# Patient Record
Sex: Female | Born: 1955 | Race: White | Hispanic: No | Marital: Married | Smoking: Former smoker
Health system: Southern US, Community
[De-identification: ages and names within clinical notes are randomized; demographics above are authoritative.]

## PROBLEM LIST (undated history)

## (undated) DIAGNOSIS — I10 Essential (primary) hypertension: Secondary | ICD-10-CM

## (undated) DIAGNOSIS — E785 Hyperlipidemia, unspecified: Secondary | ICD-10-CM

## (undated) DIAGNOSIS — R002 Palpitations: Secondary | ICD-10-CM

## (undated) DIAGNOSIS — Z720 Tobacco use: Secondary | ICD-10-CM

## (undated) HISTORY — DX: Palpitations: R00.2

## (undated) HISTORY — DX: Hyperlipidemia, unspecified: E78.5

## (undated) HISTORY — PX: BREAST EXCISIONAL BIOPSY: SUR124

## (undated) HISTORY — DX: Essential (primary) hypertension: I10

## (undated) HISTORY — DX: Tobacco use: Z72.0

## (undated) HISTORY — PX: ABDOMINAL HYSTERECTOMY: SHX81

## (undated) HISTORY — PX: BREAST LUMPECTOMY: SHX2

---

## 1997-07-23 ENCOUNTER — Ambulatory Visit (HOSPITAL_COMMUNITY): Admission: RE | Admit: 1997-07-23 | Discharge: 1997-07-23 | Payer: Self-pay | Admitting: Emergency Medicine

## 1998-06-21 ENCOUNTER — Encounter: Payer: Self-pay | Admitting: Oral Surgery

## 1998-06-25 ENCOUNTER — Ambulatory Visit (HOSPITAL_COMMUNITY): Admission: RE | Admit: 1998-06-25 | Discharge: 1998-06-26 | Payer: Self-pay | Admitting: Oral Surgery

## 2005-02-27 ENCOUNTER — Ambulatory Visit (HOSPITAL_COMMUNITY): Admission: RE | Admit: 2005-02-27 | Discharge: 2005-02-27 | Payer: Self-pay | Admitting: Obstetrics and Gynecology

## 2010-11-10 ENCOUNTER — Other Ambulatory Visit (HOSPITAL_COMMUNITY): Payer: Self-pay | Admitting: Obstetrics and Gynecology

## 2010-11-10 DIAGNOSIS — Z1231 Encounter for screening mammogram for malignant neoplasm of breast: Secondary | ICD-10-CM

## 2010-12-10 ENCOUNTER — Ambulatory Visit (HOSPITAL_COMMUNITY)
Admission: RE | Admit: 2010-12-10 | Discharge: 2010-12-10 | Disposition: A | Discharge status: Home / Self Care | Payer: Managed Care, Other (non HMO) | Source: Ambulatory Visit | Attending: Obstetrics and Gynecology | Admitting: Obstetrics and Gynecology

## 2010-12-10 DIAGNOSIS — Z1231 Encounter for screening mammogram for malignant neoplasm of breast: Secondary | ICD-10-CM | POA: Insufficient documentation

## 2010-12-17 DIAGNOSIS — R002 Palpitations: Secondary | ICD-10-CM

## 2010-12-17 DIAGNOSIS — E785 Hyperlipidemia, unspecified: Secondary | ICD-10-CM | POA: Insufficient documentation

## 2010-12-17 DIAGNOSIS — R7989 Other specified abnormal findings of blood chemistry: Secondary | ICD-10-CM | POA: Insufficient documentation

## 2010-12-17 HISTORY — DX: Palpitations: R00.2

## 2010-12-17 LAB — LIPID PANEL
Cholesterol: 286 mg/dL — AB (ref 0–200)
HDL: 43 mg/dL (ref 35–70)
LDL Cholesterol: 182 mg/dL
LDl/HDL Ratio: 61
Triglycerides: 304 mg/dL — AB (ref 40–160)

## 2010-12-17 LAB — HEPATIC FUNCTION PANEL
ALT: 54 U/L — AB (ref 7–35)
AST: 43 U/L — AB (ref 13–35)
Alkaline Phosphatase: 123 U/L (ref 25–125)

## 2011-03-29 DIAGNOSIS — E785 Hyperlipidemia, unspecified: Secondary | ICD-10-CM

## 2011-03-29 DIAGNOSIS — F17200 Nicotine dependence, unspecified, uncomplicated: Secondary | ICD-10-CM

## 2011-03-29 DIAGNOSIS — I10 Essential (primary) hypertension: Secondary | ICD-10-CM | POA: Insufficient documentation

## 2011-03-29 DIAGNOSIS — R7989 Other specified abnormal findings of blood chemistry: Secondary | ICD-10-CM

## 2011-03-29 DIAGNOSIS — Z87891 Personal history of nicotine dependence: Secondary | ICD-10-CM | POA: Insufficient documentation

## 2011-04-15 ENCOUNTER — Ambulatory Visit (INDEPENDENT_AMBULATORY_CARE_PROVIDER_SITE_OTHER): Payer: Managed Care, Other (non HMO) | Admitting: Family Medicine

## 2011-04-15 ENCOUNTER — Encounter: Payer: Self-pay | Admitting: Family Medicine

## 2011-04-15 DIAGNOSIS — R079 Chest pain, unspecified: Secondary | ICD-10-CM

## 2011-04-15 DIAGNOSIS — E785 Hyperlipidemia, unspecified: Secondary | ICD-10-CM

## 2011-04-15 DIAGNOSIS — I10 Essential (primary) hypertension: Secondary | ICD-10-CM

## 2011-04-15 LAB — HEPATIC FUNCTION PANEL
ALT: 33 U/L (ref 0–35)
AST: 31 U/L (ref 0–37)
Alkaline Phosphatase: 99 U/L (ref 39–117)
Bilirubin, Direct: 0.1 mg/dL (ref 0.0–0.3)
Indirect Bilirubin: 0.4 mg/dL (ref 0.0–0.9)
Total Bilirubin: 0.5 mg/dL (ref 0.3–1.2)

## 2011-04-15 LAB — LIPID PANEL
Cholesterol: 278 mg/dL — ABNORMAL HIGH (ref 0–200)
Total CHOL/HDL Ratio: 7.1 Ratio

## 2011-04-15 NOTE — Progress Notes (Signed)
  Subjective:    Patient ID: Marissa Howard, female    DOB: [birth year], 56 y.o.   MRN: [id number]  HPI 56 y.o Cauc female presents for HTN follow-up. She feels better on her medication however she has been having palpitations and chest tightness with pressure that has awaken her from sleep a few nights a week. This started 3-4 weeks ago. She is a smoker and has been contemplating quitting. 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 She has recently voiced her discomfort with the situation but still finds that sometimes she feels like she is holding her breath or has a hard time catching her breath. She also notes some epigastric pain and has a hx of dyspepsia which required Prilosec in the past. Leslie Dales has helped recently.    Review of Systems  Constitutional: Negative.   HENT: Negative.   Eyes: Negative.   Respiratory: Positive for chest tightness and shortness of breath. Negative for cough.   Cardiovascular: Positive for chest pain and palpitations.  Gastrointestinal: Positive for nausea and abdominal pain. Negative for vomiting, constipation and blood in stool.  Genitourinary: Negative.   Musculoskeletal: Negative for back pain.  Neurological: Negative for dizziness, syncope, weakness and light-headedness.  Psychiatric/Behavioral: The patient is nervous/anxious.        Objective:   Physical Exam  Constitutional: She is oriented to person, place, and time. She appears well-developed and well-nourished. No distress.  HENT:  Head: Normocephalic and atraumatic.  Eyes: Conjunctivae and EOM are normal. Pupils are equal, round, and reactive to light.  Neck: Normal range of motion. No thyromegaly present.  Cardiovascular: Normal rate, regular rhythm and normal heart sounds.   No murmur heard. Pulmonary/Chest: Effort normal and breath sounds normal.  Abdominal: Soft. Bowel sounds are normal. She  exhibits no mass. There is no tenderness.  Musculoskeletal: She exhibits no edema.  Neurological: She is alert and oriented to person, place, and time.  Skin: She is not diaphoretic.  Psychiatric: She has a normal mood and affect. Her behavior is normal.   ECG: NSR; no significant change from 12/17/2010 ECG. Device analysis reads "Possible septal            Infarct- age undetermined"       Assessment & Plan:   1. HTN (hypertension)  Cont current medication..Begin Tobacco cessation.  2. Hyperlipemia  Labs : Lipids, Liver function tests. Continue dietary changes and exercises.  3. Chest pain  Ref. To Cardiology for further evaluation. Seek care at Emergency Dept. if symptoms recur

## 2011-04-15 NOTE — Patient Instructions (Signed)
Chest Pain (Nonspecific) It is often hard to give a specific diagnosis for the cause of chest pain. There is always a chance that your pain could be related to something serious, such as a heart attack or a blood clot in the lungs. You need to follow up with your caregiver for further evaluation. CAUSES   Heartburn.   Pneumonia or bronchitis.   Anxiety and stress.   Inflammation around your heart (pericarditis) or lung (pleuritis or pleurisy).   A blood clot in the lung.   A collapsed lung (pneumothorax). It can develop suddenly on its own (spontaneous pneumothorax) or from injury (trauma) to the chest.  The chest wall is composed of bones, muscles, and cartilage. Any of these can be the source of the pain.  The bones can be bruised by injury.   The muscles or cartilage can be strained by coughing or overwork.   The cartilage can be affected by inflammation and become sore (costochondritis).  DIAGNOSIS  Lab tests or other studies, such as X-rays, an EKG, stress testing, or cardiac imaging, may be needed to find the cause of your pain.  TREATMENT   Treatment depends on what may be causing your chest pain. Treatment may include:   Acid blockers for heartburn.   Anti-inflammatory medicine.   Pain medicine for inflammatory conditions.   Antibiotics if an infection is present.   You may be advised to change lifestyle habits. This includes stopping smoking and avoiding caffeine and chocolate.   You may be advised to keep your head raised (elevated) when sleeping. This reduces the chance of acid going backward from your stomach into your esophagus.   Most of the time, nonspecific chest pain will improve within 2 to 3 days with rest and mild pain medicine.  HOME CARE INSTRUCTIONS   If antibiotics were prescribed, take the full amount even if you start to feel better.   For the next few days, avoid physical activities that bring on chest pain. Continue physical activities as  directed.   Do not smoke cigarettes or drink alcohol until your symptoms are gone.   Only take over-the-counter or prescription medicine for pain, discomfort, or fever as directed by your caregiver.   Follow your caregiver's suggestions for further testing if your chest pain does not go away.   Keep any follow-up appointments you made. If you do not go to an appointment, you could develop lasting (chronic) problems with pain. If there is any problem keeping an appointment, you must call to reschedule.  SEEK MEDICAL CARE IF:   You think you are having problems from the medicine you are taking. Read your medicine instructions carefully.   Your chest pain does not go away, even after treatment.   You develop a rash with blisters on your chest.  SEEK IMMEDIATE MEDICAL CARE IF:   You have increased chest pain or pain that spreads to your arm, neck, jaw, back, or belly (abdomen).   You develop shortness of breath, an increasing cough, or you are coughing up blood.   You have severe back or abdominal pain, feel sick to your stomach (nauseous) or throw up (vomit).   You develop severe weakness, fainting, or chills.   You have an oral temperature above 102 F (38.9 C), not controlled by medicine.  THIS IS AN EMERGENCY. Do not wait to see if the pain will go away. Get medical help at once. Call your local emergency services (911 in [place] Do not drive yourself to   the hospital. MAKE SURE YOU:   Understand these instructions.   Will watch your condition.   Will get help right away if you are not doing well or get worse.  Document Released: 12/10/2004 Document Revised: 11/12/2010 Document Reviewed: 10/06/2007 [employer] Patient Information 2012 [employer]. 

## 2011-04-16 ENCOUNTER — Other Ambulatory Visit: Payer: Self-pay | Admitting: Family Medicine

## 2011-04-16 DIAGNOSIS — E785 Hyperlipidemia, unspecified: Secondary | ICD-10-CM

## 2011-04-16 NOTE — Progress Notes (Signed)
Quick Note:  You labs are abnormal. Please call pt And notify her that her lipids remain significantly elevated and I recommend she start medication. I will send a Rx for Pravastatin 20 mg - 1 tab every pm after meal for 3 months. I have scheduled her to return for recheck Lipids and ALT In 8-10 weeks. Let her know that her liver function tests are normal. Pls find out from pt what Pharmacy she uses so I can route a Rx. Thanx! ______

## 2011-04-17 NOTE — Progress Notes (Signed)
Quick Note:  Please notify pt that results are normal. ______ 

## 2011-04-18 ENCOUNTER — Telehealth: Payer: Self-pay

## 2011-04-18 DIAGNOSIS — E785 Hyperlipidemia, unspecified: Secondary | ICD-10-CM

## 2011-04-18 MED ORDER — PRAVASTATIN SODIUM 20 MG PO TABS: 20.0000 mg | ORAL_TABLET | Freq: Every day | ORAL | Status: DC

## 2011-04-18 NOTE — Telephone Encounter (Signed)
Spoke with patient and clarified that rx was supposed to be Pravastatin 20mg  1 qd for 3 months.  Pt states was not at pharmacy.  Rx sent and pt aware.

## 2011-04-18 NOTE — Telephone Encounter (Signed)
Pt has been taking Atenolol for over a month (30 mg). Yesterday we called and said we were adding a med (name unk to pt) at 20mg  to take after each meal for 3 mth. She went to [facility] in [place] yesterday to pickup this new rx and was given another rx for Atenolol for only 15 pills. Pt is confused about what she should be taking. Pt phone [phone]

## 2011-04-21 ENCOUNTER — Other Ambulatory Visit: Payer: Self-pay | Admitting: Family Medicine

## 2011-05-19 ENCOUNTER — Encounter: Payer: Self-pay | Admitting: Cardiovascular Disease

## 2011-05-19 ENCOUNTER — Ambulatory Visit (INDEPENDENT_AMBULATORY_CARE_PROVIDER_SITE_OTHER): Payer: Managed Care, Other (non HMO) | Admitting: Cardiovascular Disease

## 2011-05-19 DIAGNOSIS — F17200 Nicotine dependence, unspecified, uncomplicated: Secondary | ICD-10-CM

## 2011-05-19 DIAGNOSIS — R079 Chest pain, unspecified: Secondary | ICD-10-CM | POA: Insufficient documentation

## 2011-05-19 DIAGNOSIS — I10 Essential (primary) hypertension: Secondary | ICD-10-CM

## 2011-05-19 NOTE — Assessment & Plan Note (Signed)
Exertional chest pain with associated SOB in a 56 year old smoker with personal history of HTN and hyperlipidemia and a family history of CVA and CAD. I will arrange an exercise stress myoview to exclude ischemia and an echo to exclude structural heart disease.

## 2011-05-19 NOTE — Assessment & Plan Note (Signed)
Complete smoking cessation is encouraged.

## 2011-05-19 NOTE — Progress Notes (Signed)
History of Present Illness: 56 yo WF with history of HTN, HLD and ongoing tobacco abuse who is here today for evaluation of chest pain as a new patient. She tells me that she has been feeling her heart skip beats. This occurs several times per month. This does not occur daily. She had indigestion several months ago and was treated with a PPI. She has continued to have heaviness and pressure in her chest when she walks fast. This is associated with SOB. She has had no prolonged episodes of chest pain. The pain typically resolves quickly with rest. NO prior cardiac workup. She has a strong family history of CAD in grandparents and her father has had a stroke. She has smoked 1 pack of cigarettes per day for 30 years but has cut back to 4 cigarettes per day.   Primary Care Physician: Dow Adolph   Past Medical History  Diagnosis Date  . Palpitations 12/17/2010  . Hyperlipidemia   . Hypertension   . Tobacco abuse     Past Surgical History  Procedure Date  . Abdominal hysterectomy     partial  . Breast lumpectomy     Current Outpatient Prescriptions  Medication Sig Dispense Refill  . atenolol-chlorthalidone (TENORETIC) 50-25 MG per tablet TAKE 1/2 TABLET DAILY      . calcium carbonate (OS-CAL - DOSED IN MG OF ELEMENTAL CALCIUM) 1250 MG tablet Take 1 tablet by mouth daily.      . cholecalciferol (VITAMIN D) 1000 UNITS tablet Take 1,000 Units by mouth daily.      . Multiple Vitamin (MULTIVITAMIN) tablet Take 1 tablet by mouth daily.      . pravastatin (PRAVACHOL) 20 MG tablet Take 1 tablet (20 mg total) by mouth daily.  30 tablet  2    Allergies  Allergen Reactions  . Tetracyclines & Related Other (See Comments)    Pt reports she gets "shakey"    History   Social History  . Marital Status: Married    Spouse Name: N/A    Number of Children: 3  . Years of Education: N/A   Occupational History  . Dispatcher    Social History Main Topics  . Smoking status: Current  Everyday Smoker -- 0.3 packs/day for 30 years    Types: Cigarettes  . Smokeless tobacco: Not on file  . Alcohol Use: 0.5 oz/week    1 drink(s) per week     1 wine cooler every 3 months  . Drug Use: No  . Sexually Active: Not on file   Other Topics Concern  . Not on file   Social History Narrative  . No narrative on file    Family History  Problem Relation Age of Onset  . Heart murmur Mother   . Stroke Father   . Heart attack Paternal Grandfather   . Heart attack Maternal Grandmother     Review of Systems:  As stated in the HPI and otherwise negative.   BP 113/76  Pulse 59  Ht 5\' 2"  (1.575 m)  Wt 152 lb (68.947 kg)  BMI 27.80 kg/m2  Physical Examination: General: Well developed, well nourished, NAD HEENT: OP clear, mucus membranes moist SKIN: warm, dry. No rashes. Neuro: No focal deficits Musculoskeletal: Muscle strength 5/5 all ext Psychiatric: Mood and affect normal Neck: No JVD, no carotid bruits, no thyromegaly, no lymphadenopathy. Lungs:Clear bilaterally, no wheezes, rhonci, crackles Cardiovascular: Regular rate and rhythm. No murmurs, gallops or rubs. Abdomen:Soft. Bowel sounds present. Non-tender.  Extremities: No lower  extremity edema. Pulses are 2 + in the bilateral DP/PT.  EKG: Sinus bradycardia, rate 59 bpm.

## 2011-05-19 NOTE — Patient Instructions (Signed)
Your physician recommends that you schedule a follow-up appointment in:  3 weeks.   Your physician has requested that you have an echocardiogram. Echocardiography is a painless test that uses sound waves to create images of your heart. It provides your doctor with information about the size and shape of your heart and how well your heart's chambers and valves are working. This procedure takes approximately one hour. There are no restrictions for this procedure.   Your physician has requested that you have an exercise stress myoview. For further information please visit [email or web address]. Please follow instruction sheet, as given.  

## 2011-05-19 NOTE — Assessment & Plan Note (Signed)
BP well controlled.

## 2011-05-28 ENCOUNTER — Ambulatory Visit (HOSPITAL_COMMUNITY): Payer: Managed Care, Other (non HMO) | Attending: Cardiovascular Disease | Admitting: Radiology

## 2011-05-28 DIAGNOSIS — I10 Essential (primary) hypertension: Secondary | ICD-10-CM | POA: Insufficient documentation

## 2011-05-28 DIAGNOSIS — R0602 Shortness of breath: Secondary | ICD-10-CM | POA: Insufficient documentation

## 2011-05-28 DIAGNOSIS — E785 Hyperlipidemia, unspecified: Secondary | ICD-10-CM | POA: Insufficient documentation

## 2011-05-28 DIAGNOSIS — R0789 Other chest pain: Secondary | ICD-10-CM | POA: Insufficient documentation

## 2011-05-28 DIAGNOSIS — R002 Palpitations: Secondary | ICD-10-CM | POA: Insufficient documentation

## 2011-05-28 DIAGNOSIS — Z8249 Family history of ischemic heart disease and other diseases of the circulatory system: Secondary | ICD-10-CM | POA: Insufficient documentation

## 2011-05-28 DIAGNOSIS — R079 Chest pain, unspecified: Secondary | ICD-10-CM

## 2011-05-28 DIAGNOSIS — F17200 Nicotine dependence, unspecified, uncomplicated: Secondary | ICD-10-CM | POA: Insufficient documentation

## 2011-05-28 MED ORDER — TECHNETIUM TC 99M TETROFOSMIN IV KIT
10.0000 | PACK | Freq: Once | INTRAVENOUS | Status: AC | PRN
  Administered 2011-05-28: 10 via INTRAVENOUS

## 2011-05-28 MED ORDER — REGADENOSON 0.4 MG/5ML IV SOLN
0.4000 mg | Freq: Once | INTRAVENOUS | Status: AC
  Administered 2011-05-28: 0.4 mg via INTRAVENOUS

## 2011-05-28 MED ORDER — TECHNETIUM TC 99M TETROFOSMIN IV KIT
30.0000 | PACK | Freq: Once | INTRAVENOUS | Status: AC | PRN
  Administered 2011-05-28: 30 via INTRAVENOUS

## 2011-05-28 NOTE — Progress Notes (Signed)
[facility] SITE 3 NUCLEAR MED [address] [place] [address] [phone]  Cardiology Nuclear Med Study  Marissa Howard is a 56 y.o. female [id number] [birth year]   Nuclear Med Background Indication for Stress Test:  Evaluation for Ischemia History:  No previous documented CAD Cardiac Risk Factors: Family History - CAD, Hypertension, Lipids and Smoker  Symptoms:  Chest Pressure, Palpitations and SOB   Nuclear Pre-Procedure Caffeine/Decaff Intake:  None>12 hrs  NPO After: 6:30pm   Lungs:  clear IV 0.9% NS with Angio Cath:  22g  IV Site: L Antecubital x 1, tolerated well IV Started by:  Irean Hong, RN  Chest Size (in):  38 Cup Size: C  Height: 5\' 2"  (1.575 m)  Weight:  151 lb (68.493 kg)  BMI:  Body mass index is 27.62 kg/(m^2). Tech Comments:  Tenoretic held x 24 hrs. This patient started on the treadmill but at the 8 minute mark she could no longer keep up with the treadmill. She was switched to a walking Lexiscan.     Nuclear Med Study 1 or 2 day study: 1 day  Stress Test Type:  Treadmill/Lexiscan  Reading MD: Olga Millers, MD  Order Authorizing Provider:  Verne Carrow, MD  Resting Radionuclide: Technetium 53m Tetrofosmin  Resting Radionuclide Dose: 11.0 mCi   Stress Radionuclide:  Technetium 108m Tetrofosmin  Stress Radionuclide Dose: 33.0 mCi           Stress Protocol Rest HR: 56 Stress HR: 123  Rest BP: 109/62 Stress BP: 192/71  Exercise Time (min): 10:10 METS: 9.40   Predicted Max HR: 163 bpm % Max HR: 75.46 bpm Rate Pressure Product: 21308   Dose of Adenosine (mg):  n/a Dose of Lexiscan: 0.4 mg  Dose of Atropine (mg): n/a Dose of Dobutamine: n/a mcg/kg/min (at max HR)  Stress Test Technologist: Milana Na, EMT-P  Nuclear Technologist:  Doyne Keel, CNMT     Rest Procedure:  Myocardial perfusion imaging was performed at rest 45 minutes following the intravenous administration of Technetium 22m Tetrofosmin. Rest ECG:  Sinus Bradycardia  Stress Procedure:  The patient received IV Lexiscan 0.4 mg over 15-seconds with concurrent low level exercise and then Technetium 86m Tetrofosmin was injected at 30-seconds while the patient continued walking one more minute.  There were non specific changes, sob, hot, flushed,and rare pacs/pvcs with Lexiscan.  Quantitative spect images were obtained after a 45-minute delay. Stress ECG: No significant ST segment change suggestive of ischemia.  QPS Raw Data Images:  Acquisition technically good; normal left ventricular size. Stress Images:  Normal homogeneous uptake in all areas of the myocardium. Rest Images:  Normal homogeneous uptake in all areas of the myocardium. Subtraction (SDS):  No evidence of ischemia. Transient Ischemic Dilatation (Normal <1.22):  0.95 Lung/Heart Ratio (Normal <0.45):  0.29  Quantitative Gated Spect Images QGS EDV:  57 ml QGS ESV:  11 ml QGS cine images:  NL LV Function; NL Wall Motion QGS EF: 81%  Impression Exercise Capacity:  Lexiscan with low level exercise. BP Response:  Normal blood pressure response. Clinical Symptoms:  There is dyspnea. ECG Impression:  No significant ST segment change suggestive of ischemia. Comparison with Prior Nuclear Study: No previous nuclear study performed  Overall Impression:  Normal stress nuclear study.   Olga Millers

## 2011-05-29 ENCOUNTER — Other Ambulatory Visit: Payer: Self-pay

## 2011-05-29 ENCOUNTER — Ambulatory Visit (HOSPITAL_COMMUNITY): Payer: Managed Care, Other (non HMO) | Attending: Internal Medicine

## 2011-05-29 DIAGNOSIS — R072 Precordial pain: Secondary | ICD-10-CM

## 2011-05-29 DIAGNOSIS — Z8249 Family history of ischemic heart disease and other diseases of the circulatory system: Secondary | ICD-10-CM | POA: Insufficient documentation

## 2011-05-29 DIAGNOSIS — R079 Chest pain, unspecified: Secondary | ICD-10-CM | POA: Insufficient documentation

## 2011-05-29 DIAGNOSIS — R0989 Other specified symptoms and signs involving the circulatory and respiratory systems: Secondary | ICD-10-CM | POA: Insufficient documentation

## 2011-05-29 DIAGNOSIS — I10 Essential (primary) hypertension: Secondary | ICD-10-CM | POA: Insufficient documentation

## 2011-05-29 DIAGNOSIS — R0609 Other forms of dyspnea: Secondary | ICD-10-CM | POA: Insufficient documentation

## 2011-06-15 ENCOUNTER — Ambulatory Visit (INDEPENDENT_AMBULATORY_CARE_PROVIDER_SITE_OTHER): Payer: Managed Care, Other (non HMO) | Admitting: Cardiovascular Disease

## 2011-06-15 ENCOUNTER — Encounter (INDEPENDENT_AMBULATORY_CARE_PROVIDER_SITE_OTHER): Payer: Managed Care, Other (non HMO)

## 2011-06-15 ENCOUNTER — Encounter: Payer: Self-pay | Admitting: Cardiovascular Disease

## 2011-06-15 VITALS — BP 140/80 | HR 62 | Ht 62.0 in | Wt 152.0 lb

## 2011-06-15 DIAGNOSIS — R002 Palpitations: Secondary | ICD-10-CM

## 2011-06-15 DIAGNOSIS — R079 Chest pain, unspecified: Secondary | ICD-10-CM

## 2011-06-15 NOTE — Patient Instructions (Signed)
Your physician has recommended that you wear a holter monitor. Holter monitors are medical devices that record the heart's electrical activity. Doctors most often use these monitors to diagnose arrhythmias. Arrhythmias are problems with the speed or rhythm of the heartbeat. The monitor is a small, portable device. You can wear one while you do your normal daily activities. This is usually used to diagnose what is causing palpitations/syncope (passing out).  Your physician recommends that you schedule a follow-up appointment as needed with Dr. Clifton James

## 2011-06-15 NOTE — Assessment & Plan Note (Signed)
No evidence of ischemia on stress testing. Echo is normal. Smoking cessation is recommended.

## 2011-06-15 NOTE — Assessment & Plan Note (Signed)
Will arrange 48 hour Holter monitor.

## 2011-06-15 NOTE — Progress Notes (Signed)
History of Present Illness: 56 yo WF with history of HTN, HLD and ongoing tobacco abuse who is here today for cardiac follow up. I saw her three weeks ago for evaluation of chest pain. She told me that she has been feeling her heart skip beats. This occurs several times per month. This did not occur daily. She had indigestion several months ago and was treated with a PPI. She has continued to have heaviness and pressure in her chest when she walks fast. This is associated with SOB. She has had no prolonged episodes of chest pain. The pain typically resolves quickly with rest. NO prior cardiac workup. She has a strong family history of CAD in grandparents and her father has had a stroke. She has smoked 1 pack of cigarettes per day for 30 years but has cut back to 4 cigarettes per day. I arranged an echo and a stress myoview. Her myoview showed no evidence of ischemia. Her echo showed normal LV function and no significant valvular disease.    She tells me that she has had one episode of chest pain last week. This seemed almost like her heart skipped. Not prolonged. No SOB.   Primary Care Physician: Dow Adolph   Past Medical History  Diagnosis Date  . Palpitations 12/17/2010  . Hyperlipidemia   . Hypertension   . Tobacco abuse     Past Surgical History  Procedure Date  . Abdominal hysterectomy     partial  . Breast lumpectomy     Current Outpatient Prescriptions  Medication Sig Dispense Refill  . atenolol-chlorthalidone (TENORETIC) 50-25 MG per tablet TAKE 1/2 TABLET DAILY      . calcium carbonate (OS-CAL - DOSED IN MG OF ELEMENTAL CALCIUM) 1250 MG tablet Take 1 tablet by mouth daily.      . cholecalciferol (VITAMIN D) 1000 UNITS tablet Take 1,000 Units by mouth daily.      . Multiple Vitamin (MULTIVITAMIN) tablet Take 1 tablet by mouth daily.      . pravastatin (PRAVACHOL) 20 MG tablet Take 1 tablet (20 mg total) by mouth daily.  30 tablet  2    Allergies  Allergen Reactions    . Tetracyclines & Related Other (See Comments)    Pt reports she gets "shakey"    History   Social History  . Marital Status: Married    Spouse Name: N/A    Number of Children: 3  . Years of Education: N/A   Occupational History  . Dispatcher    Social History Main Topics  . Smoking status: Current Everyday Smoker -- 0.3 packs/day for 30 years    Types: Cigarettes  . Smokeless tobacco: Not on file  . Alcohol Use: 0.5 oz/week    1 drink(s) per week     1 wine cooler every 3 months  . Drug Use: No  . Sexually Active: Not on file   Other Topics Concern  . Not on file   Social History Narrative  . No narrative on file    Family History  Problem Relation Age of Onset  . Heart murmur Mother   . Stroke Father   . Heart attack Paternal Grandfather   . Heart attack Maternal Grandmother     Review of Systems:  As stated in the HPI and otherwise negative.   BP 140/80  Pulse 62  Ht 5\' 2"  (1.575 m)  Wt 152 lb (68.947 kg)  BMI 27.80 kg/m2  Physical Examination: General: Well developed, well nourished, NAD  HEENT: OP clear, mucus membranes moist SKIN: warm, dry. No rashes. Neuro: No focal deficits Musculoskeletal: Muscle strength 5/5 all ext Psychiatric: Mood and affect normal Neck: No JVD, no carotid bruits, no thyromegaly, no lymphadenopathy. Lungs:Clear bilaterally, no wheezes, rhonci, crackles Cardiovascular: Regular rate and rhythm. No murmurs, gallops or rubs. Abdomen:Soft. Bowel sounds present. Non-tender.  Extremities: No lower extremity edema. Pulses are 2 + in the bilateral DP/PT.  Echo 05/29/11: Left ventricle: The cavity size was normal. Wall thickness was normal. Systolic function was vigorous. The estimated ejection fraction was in the range of 65% to 70%. Features are consistent with a pseudonormal left ventricular filling pattern, with concomitant abnormal relaxation and increased filling pressure (grade 2 diastolic dysfunction).   Stress  myoview: 05/28/11: Stress Procedure: The patient received IV Lexiscan 0.4 mg over 15-seconds with concurrent low level exercise and then Technetium 54m Tetrofosmin was injected at 30-seconds while the patient continued walking one more minute. There were non specific changes, sob, hot, flushed,and rare pacs/pvcs with Lexiscan. Quantitative spect images were obtained after a 45-minute delay.  Stress ECG: No significant ST segment change suggestive of ischemia.  QPS  Raw Data Images: Acquisition technically good; normal left ventricular size.  Stress Images: Normal homogeneous uptake in all areas of the myocardium.  Rest Images: Normal homogeneous uptake in all areas of the myocardium.  Subtraction (SDS): No evidence of ischemia.  Transient Ischemic Dilatation (Normal <1.22): 0.95  Lung/Heart Ratio (Normal <0.45): 0.29  Quantitative Gated Spect Images  QGS EDV: 57 ml  QGS ESV: 11 ml  QGS cine images: NL LV Function; NL Wall Motion  QGS EF: 81%  Impression  Exercise Capacity: Lexiscan with low level exercise.  BP Response: Normal blood pressure response.  Clinical Symptoms: There is dyspnea.  ECG Impression: No significant ST segment change suggestive of ischemia.  Comparison with Prior Nuclear Study: No previous nuclear study performed  Overall Impression: Normal stress nuclear study.

## 2011-06-16 ENCOUNTER — Ambulatory Visit: Payer: Managed Care, Other (non HMO) | Admitting: Family Medicine

## 2011-06-18 ENCOUNTER — Encounter: Payer: Self-pay | Admitting: Family Medicine

## 2011-06-18 ENCOUNTER — Ambulatory Visit (INDEPENDENT_AMBULATORY_CARE_PROVIDER_SITE_OTHER): Payer: Managed Care, Other (non HMO) | Admitting: Family Medicine

## 2011-06-18 VITALS — BP 120/70 | HR 57 | Temp 98.2°F | Resp 16 | Ht 62.0 in | Wt 151.2 lb

## 2011-06-18 DIAGNOSIS — I10 Essential (primary) hypertension: Secondary | ICD-10-CM

## 2011-06-18 DIAGNOSIS — E785 Hyperlipidemia, unspecified: Secondary | ICD-10-CM

## 2011-06-18 MED ORDER — PRAVASTATIN SODIUM 20 MG PO TABS: 20.0000 mg | ORAL_TABLET | Freq: Every day | ORAL | Status: DC

## 2011-06-18 NOTE — Progress Notes (Signed)
  Subjective:    Patient ID: Marissa Howard, female    DOB: 1955/10/03, 56 y.o.   MRN: [id number]  HPI  This pt is here for follow-up of HTN and elevated lipids. She is compliant with medications and   has no problems with side effects. She was seen 06/15/11 at [facility] for evaluation of  palpitations. She has no complaints today of chest pain or palpitations.     Review of Systems Noncontributory    Objective:   Physical Exam  Vitals reviewed. Constitutional: She is oriented to person, place, and time. She appears well-developed and well-nourished. No distress.  HENT:  Head: Normocephalic and atraumatic.  Eyes: Conjunctivae and EOM are normal. No scleral icterus.  Cardiovascular: Normal rate and regular rhythm.   Pulmonary/Chest: Effort normal. No respiratory distress.  Neurological: She is alert and oriented to person, place, and time.  Skin: Skin is warm and dry.          Assessment & Plan:   1. Hyperlipidemia  pravastatin (PRAVACHOL) 20 MG tablet; medication refilled without dose change. RTC next week for fasting labs.

## 2011-06-18 NOTE — Patient Instructions (Signed)

## 2011-06-22 ENCOUNTER — Other Ambulatory Visit: Payer: Self-pay | Admitting: Family Medicine

## 2011-06-22 ENCOUNTER — Other Ambulatory Visit (INDEPENDENT_AMBULATORY_CARE_PROVIDER_SITE_OTHER): Payer: Managed Care, Other (non HMO) | Admitting: *Deleted

## 2011-06-22 ENCOUNTER — Other Ambulatory Visit: Payer: Self-pay | Admitting: *Deleted

## 2011-06-22 DIAGNOSIS — E785 Hyperlipidemia, unspecified: Secondary | ICD-10-CM

## 2011-06-23 ENCOUNTER — Encounter: Payer: Self-pay | Admitting: Family Medicine

## 2011-06-23 LAB — LIPID PANEL
HDL: 35 mg/dL — ABNORMAL LOW (ref >39)
LDL Cholesterol: 139 mg/dL — ABNORMAL HIGH (ref 0–99)
Triglycerides: 260 mg/dL — ABNORMAL HIGH (ref <150)
VLDL: 52 mg/dL — ABNORMAL HIGH (ref 0–40)

## 2011-06-25 MED ORDER — PRAVASTATIN SODIUM 20 MG PO TABS: ORAL_TABLET | ORAL | Status: DC

## 2011-06-25 NOTE — Progress Notes (Signed)
Left message with husband for patient to call back.

## 2011-06-25 NOTE — Progress Notes (Signed)
Addended by: Dow Adolph B on: 06/25/2011 01:27 PM   Modules accepted: Orders

## 2011-06-25 NOTE — Progress Notes (Signed)
Quick Note:  Please call pt and advise that the following labs are abnormal...    Lipids are still elevated; Pravastatin dose has been increased to 40 mg and routed to pharmacy. Take 2 20-mg tablets every day until current pills are gone then start the higher dose tablet. We will check lipids again at next visit. Continue to maintain healthy nutrition and regular exercise.  Provide copy of labs to pt. ______

## 2011-06-26 NOTE — Progress Notes (Signed)
See note under lab results. Pt was sent a copy of labs w/instructions from Dr Audria Nine

## 2011-08-19 ENCOUNTER — Telehealth: Payer: Self-pay

## 2011-08-19 ENCOUNTER — Ambulatory Visit (INDEPENDENT_AMBULATORY_CARE_PROVIDER_SITE_OTHER): Payer: Managed Care, Other (non HMO) | Admitting: Physician Assistant

## 2011-08-19 VITALS — BP 135/78 | HR 58 | Temp 97.5°F | Resp 16 | Ht 63.5 in | Wt 151.8 lb

## 2011-08-19 DIAGNOSIS — L255 Unspecified contact dermatitis due to plants, except food: Secondary | ICD-10-CM

## 2011-08-19 DIAGNOSIS — L237 Allergic contact dermatitis due to plants, except food: Secondary | ICD-10-CM

## 2011-08-19 MED ORDER — TRIAMCINOLONE ACETONIDE 0.1 % EX CREA: TOPICAL_CREAM | Freq: Two times a day (BID) | CUTANEOUS | Status: AC

## 2011-08-19 MED ORDER — PREDNISONE 10 MG PO TABS: ORAL_TABLET | ORAL | Status: DC

## 2011-08-19 NOTE — Telephone Encounter (Signed)
Pt was seen

## 2011-08-19 NOTE — Progress Notes (Signed)
  Subjective:    Patient ID: Marissa Howard, female    DOB: 1955/05/16, 56 y.o.   MRN: [id number]  HPI 56 yr old CF presents with pruritic rash after coming into contact with poison ivy ~5days ago when doing yard work.  It is present on both hands/arms, both lower legs and ankles.  Not taking OTCs.   Review of Systems  All other systems reviewed and are negative.       Objective:   Physical Exam  Nursing note and vitals reviewed. Constitutional: She is oriented to person, place, and time. She appears well-developed and well-nourished.  HENT:  Head: Normocephalic and atraumatic.  Neck: Normal range of motion. Neck supple.  Cardiovascular: Normal rate, regular rhythm and normal heart sounds.   Pulmonary/Chest: Effort normal and breath sounds normal.  Neurological: She is alert and oriented to person, place, and time.  Skin: Rash (contact distribution vesicular rash in different stages along B hands, streaking up forearms to elbows and same on ankles and lower legs.) noted.          Assessment & Plan:  Poison ivy-zyrtec 10mg  qd and Rx cream.  If no improvement in 48 hrs she will add the prednisone prescription(her last labs in 12/2010 showed normal glucose and she has no h/o diabetes.)

## 2011-08-19 NOTE — Telephone Encounter (Signed)
Is there something over the counter we can recommend for her

## 2011-08-19 NOTE — Telephone Encounter (Signed)
Calamine lotion or OTC hydrocortisone cream can be helpful. May take benadryl or zyrtec to help with itch. If no improvement come in for evaluation.

## 2011-08-19 NOTE — Telephone Encounter (Signed)
Pt wants to know what she can do for poison oak?  Please call at work

## 2011-08-29 ENCOUNTER — Other Ambulatory Visit: Payer: Self-pay | Admitting: Family Medicine

## 2011-09-23 ENCOUNTER — Ambulatory Visit (INDEPENDENT_AMBULATORY_CARE_PROVIDER_SITE_OTHER): Payer: Managed Care, Other (non HMO) | Admitting: Emergency Medicine

## 2011-09-23 VITALS — BP 102/60 | HR 57 | Temp 98.1°F | Resp 16 | Ht 62.0 in | Wt 152.0 lb

## 2011-09-23 DIAGNOSIS — H6590 Unspecified nonsuppurative otitis media, unspecified ear: Secondary | ICD-10-CM

## 2011-09-23 DIAGNOSIS — I10 Essential (primary) hypertension: Secondary | ICD-10-CM

## 2011-09-23 DIAGNOSIS — E782 Mixed hyperlipidemia: Secondary | ICD-10-CM

## 2011-09-23 MED ORDER — PSEUDOEPHEDRINE-GUAIFENESIN ER 60-600 MG PO TB12: 1.0000 | ORAL_TABLET | Freq: Two times a day (BID) | ORAL | Status: AC

## 2011-09-23 NOTE — Progress Notes (Signed)
  Subjective:    Patient ID: Marissa Howard, female    DOB: 12/26/1955, 56 y.o.   MRN: [id number]  Otalgia  There is pain in the right ear. This is a new problem. The current episode started in the past 7 days. The problem occurs every few hours. The problem has been waxing and waning. There has been no fever. The patient is experiencing no pain. Associated symptoms include hearing loss. Pertinent negatives include no abdominal pain, coughing, diarrhea, drainage, ear discharge, headaches, neck pain, rash, rhinorrhea, sore throat or vomiting. She has tried nothing for the symptoms. There is no history of a chronic ear infection, hearing loss or a tympanostomy tube.      Review of Systems  Constitutional: Negative.   HENT: Positive for hearing loss and ear pain. Negative for sore throat, rhinorrhea, neck pain and ear discharge.   Eyes: Negative.   Respiratory: Negative.  Negative for cough.   Cardiovascular: Negative.   Gastrointestinal: Negative.  Negative for vomiting, abdominal pain and diarrhea.  Genitourinary: Negative.   Musculoskeletal: Negative.   Skin: Negative for rash.  Neurological: Negative for headaches.       Objective:   Physical Exam  Constitutional: She is oriented to person, place, and time. She appears well-developed and well-nourished.  HENT:  Head: Normocephalic and atraumatic.  Right Ear: A middle ear effusion is present.  Left Ear: Hearing, tympanic membrane, external ear and ear canal normal.  Eyes: Conjunctivae are normal. Pupils are equal, round, and reactive to light. No scleral icterus.  Neck: Normal range of motion. Neck supple.  Cardiovascular: Normal rate.   Pulmonary/Chest: Effort normal.  Musculoskeletal: Normal range of motion.  Neurological: She is alert and oriented to person, place, and time.  Skin: Skin is warm.          Assessment & Plan:  Serous otitis media.  Scant wax in external canal Mucinex d RTC prn

## 2011-09-23 NOTE — Addendum Note (Signed)
Addended by: Carmelina Dane on: 09/23/2011 03:42 PM   Modules accepted: Orders

## 2011-09-23 NOTE — Patient Instructions (Signed)
Serous Otitis Media   Serous otitis media is also known as otitis media with effusion (OME). It means there is fluid in the middle ear space. This space contains the bones for hearing and air. Air in the middle ear space helps to transmit sound.   The air gets there through the eustachian tube. This tube goes from the back of the throat to the middle ear space. It keeps the pressure in the middle ear the same as the outside world. It also helps to drain fluid from the middle ear space.  CAUSES   OME occurs when the eustachian tube gets blocked. Blockage can come from:   Ear infections.   Colds and other upper respiratory infections.   Allergies.   Irritants such as cigarette smoke.   Sudden changes in air pressure (such as descending in an airplane).   Enlarged adenoids.  During colds and upper respiratory infections, the middle ear space can become temporarily filled with fluid. This can happen after an ear infection also. Once the infection clears, the fluid will generally drain out of the ear through the eustachian tube. If it does not, then OME occurs.  SYMPTOMS    Hearing loss.   A feeling of fullness in the ear - but no pain.   Young children may not show any symptoms.  DIAGNOSIS    Diagnosis of OME is made by an ear exam.   Tests may be done to check on the movement of the eardrum.   Hearing exams may be done.  TREATMENT    The fluid most often goes away without treatment.   If allergy is the cause, allergy treatment may be helpful.   Fluid that persists for several months may require minor surgery. A small tube is placed in the ear drum to:   Drain the fluid.   Restore the air in the middle ear space.   In certain situations, antibiotics are used to avoid surgery.   Surgery may be done to remove enlarged adenoids (if this is the cause).  HOME CARE INSTRUCTIONS    Keep children away from tobacco smoke.   Be sure to keep follow up appointments, if any.  SEEK MEDICAL CARE IF:    Hearing is  not better in 3 months.   Hearing is worse.   Ear pain.   Drainage from the ear.   Dizziness.  Document Released: 05/23/2003 Document Revised: 02/19/2011 Document Reviewed: 03/22/2008  Exit[employer] Patient Information 2012 [employer].

## 2011-09-24 ENCOUNTER — Ambulatory Visit (INDEPENDENT_AMBULATORY_CARE_PROVIDER_SITE_OTHER): Payer: Managed Care, Other (non HMO) | Admitting: Emergency Medicine

## 2011-09-24 DIAGNOSIS — E782 Mixed hyperlipidemia: Secondary | ICD-10-CM

## 2011-09-24 DIAGNOSIS — H6590 Unspecified nonsuppurative otitis media, unspecified ear: Secondary | ICD-10-CM

## 2011-09-24 DIAGNOSIS — I10 Essential (primary) hypertension: Secondary | ICD-10-CM

## 2011-09-24 LAB — LIPID PANEL
Cholesterol: 251 mg/dL — ABNORMAL HIGH (ref 0–200)
HDL: 35 mg/dL — ABNORMAL LOW (ref >39)
LDL Cholesterol: 148 mg/dL — ABNORMAL HIGH (ref 0–99)
Triglycerides: 339 mg/dL — ABNORMAL HIGH (ref <150)

## 2011-09-24 LAB — COMPREHENSIVE METABOLIC PANEL
ALT: 26 U/L (ref 0–35)
BUN: 12 mg/dL (ref 6–23)
CO2: 27 mEq/L (ref 19–32)
Calcium: 9.8 mg/dL (ref 8.4–10.5)
Creat: 0.87 mg/dL (ref 0.50–1.10)
Glucose, Bld: 100 mg/dL — ABNORMAL HIGH (ref 70–99)
Total Bilirubin: 0.5 mg/dL (ref 0.3–1.2)

## 2011-09-24 NOTE — Progress Notes (Signed)
  Subjective:    Patient ID: Marissa Howard, female    DOB: December 15, 1955, 56 y.o.   MRN: [id number]  HPI For lab draw only    Review of Systems As per HPI, otherwise negative.      Objective:   Physical Exam  Not seen      Assessment & Plan:  Lab draw

## 2011-09-25 ENCOUNTER — Encounter: Payer: Self-pay | Admitting: Emergency Medicine

## 2011-09-25 ENCOUNTER — Other Ambulatory Visit: Payer: Self-pay | Admitting: Emergency Medicine

## 2011-09-25 DIAGNOSIS — E785 Hyperlipidemia, unspecified: Secondary | ICD-10-CM

## 2011-09-25 MED ORDER — PRAVASTATIN SODIUM 40 MG PO TABS: ORAL_TABLET | ORAL | Status: DC

## 2011-11-01 ENCOUNTER — Other Ambulatory Visit: Payer: Self-pay | Admitting: Physician Assistant

## 2011-11-01 ENCOUNTER — Telehealth: Payer: Self-pay

## 2011-11-01 NOTE — Telephone Encounter (Signed)
Patient was here in July for BP medication. Her pharmacy let her know that she was out of refills for this med and she is unsure why because of how recently she was seen here. Please contact her at [phone]. During the week after 8am she can be reached at [phone].

## 2011-11-01 NOTE — Telephone Encounter (Signed)
Pt notified that rx was sent in to pharmacy

## 2011-11-13 ENCOUNTER — Telehealth: Payer: Self-pay | Admitting: *Deleted

## 2011-11-13 NOTE — Telephone Encounter (Signed)
Pt had holter monitor applied on June 15, 2011. Per Windell Moulding the monitor only recorded for 2 hours. Windell Moulding has contacted pt to reschedule but pt has not done so yet. I spoke with pt today and she did not want to reschedule appt to wear monitor.

## 2012-01-27 ENCOUNTER — Other Ambulatory Visit (HOSPITAL_COMMUNITY): Payer: Self-pay | Admitting: Obstetrics and Gynecology

## 2012-01-27 DIAGNOSIS — Z1231 Encounter for screening mammogram for malignant neoplasm of breast: Secondary | ICD-10-CM

## 2012-02-15 ENCOUNTER — Other Ambulatory Visit: Payer: Self-pay | Admitting: Emergency Medicine

## 2012-03-03 ENCOUNTER — Ambulatory Visit (HOSPITAL_COMMUNITY)
Admission: RE | Admit: 2012-03-03 | Discharge: 2012-03-03 | Disposition: A | Discharge status: Home / Self Care | Payer: Managed Care, Other (non HMO) | Source: Ambulatory Visit | Attending: Obstetrics and Gynecology | Admitting: Obstetrics and Gynecology

## 2012-03-03 ENCOUNTER — Encounter: Payer: Self-pay | Admitting: Family Medicine

## 2012-03-03 ENCOUNTER — Ambulatory Visit (INDEPENDENT_AMBULATORY_CARE_PROVIDER_SITE_OTHER): Payer: Managed Care, Other (non HMO) | Admitting: Family Medicine

## 2012-03-03 VITALS — BP 106/68 | HR 52 | Temp 98.1°F | Resp 16 | Ht 62.5 in | Wt 149.0 lb

## 2012-03-03 DIAGNOSIS — I10 Essential (primary) hypertension: Secondary | ICD-10-CM

## 2012-03-03 DIAGNOSIS — E785 Hyperlipidemia, unspecified: Secondary | ICD-10-CM

## 2012-03-03 DIAGNOSIS — Z1231 Encounter for screening mammogram for malignant neoplasm of breast: Secondary | ICD-10-CM

## 2012-03-03 DIAGNOSIS — Z23 Encounter for immunization: Secondary | ICD-10-CM

## 2012-03-03 LAB — LIPID PANEL
Cholesterol: 201 mg/dL — ABNORMAL HIGH (ref 0–200)
HDL: 34 mg/dL — ABNORMAL LOW (ref >39)
Total CHOL/HDL Ratio: 5.9 Ratio
Triglycerides: 245 mg/dL — ABNORMAL HIGH (ref <150)

## 2012-03-03 LAB — BASIC METABOLIC PANEL
BUN: 13 mg/dL (ref 6–23)
CO2: 27 mEq/L (ref 19–32)
Calcium: 9.4 mg/dL (ref 8.4–10.5)
Chloride: 104 mEq/L (ref 96–112)
Creat: 0.81 mg/dL (ref 0.50–1.10)
Glucose, Bld: 96 mg/dL (ref 70–99)

## 2012-03-03 LAB — ALT: ALT: 22 U/L (ref 0–35)

## 2012-03-03 MED ORDER — PRAVASTATIN SODIUM 40 MG PO TABS: 40.0000 mg | ORAL_TABLET | Freq: Every day | ORAL | Status: DC

## 2012-03-03 NOTE — Progress Notes (Signed)
S:  This 56 y.o. Cauc female has HTN; she is compliant w/ medication but occasionally has lightheadedness when standing. Pt reports having to steady self when ambulating at times. She denies other adverse effects w/ medications. She had lipid disorder and tries to practice good nutrition habits. She denies myalgias or back pain or weakness that may be related to statin.  ROS: Negative for unexplained weight loss, fatigue, vision disturbances, CP or tightness, SOB or DOE, palpitations, edema, Abd pain, HA, weakness, numbness or syncope.  O:  Filed Vitals:   03/03/12 0916  BP: 106/68  Pulse: 52  Temp: 98.1 F (36.7 C)  Resp: 16   GEN: In NAD; WN,WD. HENT: [place]/AT. EOMI w/ clear conj/ sclerae.Otherwise unremarkable. COR: RRR. No edema. LUNGS: Normal resp rate and effort. MS: FROM. NEURO: A&O x 3; CNs intact. Nonfocal.  July 2013 Lipids:  TChol= 251  TGs= 339  HDL= 35  LDL= 148   TChol/HDL ratio= 7.2  A/P:  1. Dyslipidemia (high LDL; low HDL)  Currently, pt's risk of CHD is 2x average for women Get information re: Mediterranean Diet or [facility] Lipid-lowering Diet Lipid panel, ALT  2. HTN, goal below 140/80  Discontinue Atenolol- Chlorthalidone Monitor BP at home; RTC 3 months for recheck Basic metabolic panel  3. Need for prophylactic vaccination and inoculation against influenza

## 2012-03-03 NOTE — Patient Instructions (Addendum)
I want you to discontinue your BP medication; you will return in 3 months for recheck.   As far as information for lipid-lowering diet, try the Mediterranean Diet . Also you can go to the [facility] web-site [facility] ?) for information about Lipid-lowering nutrition.

## 2012-03-07 ENCOUNTER — Encounter: Payer: Self-pay | Admitting: Family Medicine

## 2012-03-23 ENCOUNTER — Telehealth: Payer: Self-pay

## 2012-03-23 NOTE — Telephone Encounter (Signed)
Pt thought that Dr. Audria Nine called in a rx for cholesteral and when she went to the pharmacy there was nothing there  Please call [phone]

## 2012-03-24 MED ORDER — PRAVASTATIN SODIUM 40 MG PO TABS: 40.0000 mg | ORAL_TABLET | Freq: Every day | ORAL | Status: DC

## 2012-03-24 NOTE — Addendum Note (Signed)
Addended byCaffie Damme on: 03/24/2012 09:29 AM   Modules accepted: Orders

## 2012-03-24 NOTE — Telephone Encounter (Signed)
pravachol was sent in for her.

## 2012-03-24 NOTE — Telephone Encounter (Signed)
Called patient to advise  °

## 2012-03-24 NOTE — Telephone Encounter (Signed)
The Rx was not at [facility], I have resubmitted this for her.

## 2012-05-09 ENCOUNTER — Telehealth: Payer: Self-pay

## 2012-05-09 NOTE — Telephone Encounter (Signed)
PATIENT SAYS SHE HAS RECEIVED SEVERAL CALLS FROM OUR OFFICE. SHE HAS NO VOICEMAIL SET UP. SHE SAYS SHE' WORRIED ITS  ABOUT HER MEDICATION INTERFERING WITH HER LAB WORK FOR HER APPOINTMENT HERE FOR THE 26TH. BEST NUMBER NOW IS HER HOME NUMBER: [phone]

## 2012-05-10 NOTE — Telephone Encounter (Signed)
Thanks, but I have not called her. Was probably reminder call about her appt. Called, again no answer.

## 2012-05-11 ENCOUNTER — Encounter: Payer: Self-pay | Admitting: Family Medicine

## 2012-05-11 ENCOUNTER — Ambulatory Visit (INDEPENDENT_AMBULATORY_CARE_PROVIDER_SITE_OTHER): Payer: Managed Care, Other (non HMO) | Admitting: Family Medicine

## 2012-05-11 VITALS — BP 132/77 | HR 82 | Temp 97.8°F | Resp 16 | Ht 62.5 in | Wt 149.0 lb

## 2012-05-11 DIAGNOSIS — E785 Hyperlipidemia, unspecified: Secondary | ICD-10-CM

## 2012-05-11 MED ORDER — PRAVASTATIN SODIUM 40 MG PO TABS: 40.0000 mg | ORAL_TABLET | Freq: Every day | ORAL | Status: DC

## 2012-05-11 NOTE — Progress Notes (Signed)
S:  This 57 y.o. Cauc female is here for follow-up of lipid disorder. She is currently off blood pressure medication and feels well. She has been planning weight loss; there is a treadmill at work that she can use early in the morning prior to starting work day. 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 It will be easier for her to walk on a treadmill because of knee discomfort. Walking outdoors in her neighborhood is not an option because it is not "pedestrian friendly".  In warmer weather, she enjoys gardening and other outdoor activities. She is monitoring nutrition and is compliant with Pravastatin without side effects. She does continue to smoke.  ROS: Negative for fatigue, abnormal weight change, appetite change, CP or tightness, palpitations, SOB or DOE, cough, GI upset or changes, myalgias, HA, weakness, numbness or syncope.  O:  Filed Vitals:   05/11/12 0748  BP: 132/77  Pulse: 82  Temp: 97.8 F (36.6 C)  Resp: 16   GEN: In NAD; WN,WD. HENT: [place]/AT; EOMI w/ clear conj/ sclerae. Oroph clear and moist. COR: RRR. LUNGS: Normal resp rate and effort. SKIN: W&D. NEURO: A&O x 3; CNs intact. Nonfocal.  Results for orders placed in visit on 03/03/12  LIPID PANEL      Result Value Range   Cholesterol 201 (*) 0 - 200 mg/dL   Triglycerides 086 (*) <150 mg/dL   HDL 34 (*) >57 mg/dL   Total CHOL/HDL Ratio 5.9     VLDL 49 (*) 0 - 40 mg/dL   LDL Cholesterol 846 (*) 0 - 99 mg/dL  ALT      Result Value Range   ALT 22  0 - 35 U/L  BASIC METABOLIC PANEL      Result Value Range   Sodium 138  135 - 145 mEq/L   Potassium 4.2  3.5 - 5.3 mEq/L   Chloride 104  96 - 112 mEq/L   CO2 27  19 - 32 mEq/L   Glucose, Bld 96  70 - 99 mg/dL   BUN 13  6 - 23 mg/dL   Creat 9.62  9.52 - 8.41 mg/dL   Calcium 9.4  8.4 - 32.4 mg/dL    A/P:  Other and unspecified hyperlipidemia- much improved compared to 1 year ago; CAD risk is above average.  Have advised nutrition changes to  increase "good fats" in diet and Fish Oil capsule daily. Encouraged regular physical activity 3-4 times per week. Continue Pravastatin at current dose.  Meds ordered this encounter  Medications  .       .       . pravastatin (PRAVACHOL) 40 MG tablet    Sig: Take 1 tablet (40 mg total) by mouth daily.    Dispense:  90 tablet    Refill:  1

## 2012-05-11 NOTE — Patient Instructions (Signed)
I want you to try MegaRed Fish Oil capsule once a day and try to eat more extra virgin oil, walnuts and salmon or cod (which are fatty fish). Regular exercise will help raise your HDL ("good") cholesterol.

## 2012-06-02 ENCOUNTER — Ambulatory Visit: Payer: Managed Care, Other (non HMO) | Admitting: Family Medicine

## 2012-09-14 ENCOUNTER — Encounter: Payer: Self-pay | Admitting: Family Medicine

## 2012-09-14 ENCOUNTER — Ambulatory Visit (INDEPENDENT_AMBULATORY_CARE_PROVIDER_SITE_OTHER): Payer: Managed Care, Other (non HMO) | Admitting: Family Medicine

## 2012-09-14 VITALS — BP 130/82 | HR 70 | Temp 98.7°F | Resp 16 | Ht 62.0 in | Wt 149.8 lb

## 2012-09-14 DIAGNOSIS — E785 Hyperlipidemia, unspecified: Secondary | ICD-10-CM

## 2012-09-14 DIAGNOSIS — Z760 Encounter for issue of repeat prescription: Secondary | ICD-10-CM

## 2012-09-14 LAB — COMPREHENSIVE METABOLIC PANEL
AST: 23 U/L (ref 0–37)
Albumin: 4.8 g/dL (ref 3.5–5.2)
Alkaline Phosphatase: 97 U/L (ref 39–117)
Glucose, Bld: 88 mg/dL (ref 70–99)
Potassium: 4.4 mEq/L (ref 3.5–5.3)
Sodium: 142 mEq/L (ref 135–145)
Total Protein: 7.1 g/dL (ref 6.0–8.3)

## 2012-09-14 LAB — LIPID PANEL: Total CHOL/HDL Ratio: 6.3 Ratio

## 2012-09-14 MED ORDER — PRAVASTATIN SODIUM 40 MG PO TABS: 40.0000 mg | ORAL_TABLET | Freq: Every day | ORAL | Status: DC

## 2012-09-14 NOTE — Patient Instructions (Signed)
You have a small cyst on the back of your right ear that you want removed. You can walk-in at [address] [facility] building (late morning to early afternoon on the weekends is best) and have it removed. If the wait time is too long, ask to be scheduled with one of the PAs to have it removed at [address] [facility] ([facility]).

## 2012-09-14 NOTE — Progress Notes (Signed)
S:  This 57 y.o. Cauc female is here for HTN follow-up. She has elevated lipids and is taking Pravastatin w/o adverse effects. The pt feels well. She exercises by walking and is working full time. She denies fatigue, diaphoresis, CP or tightness, palpitations, edema, GI problems, myalgias, HA, dizziness, numbness, weakness or syncope. She has some sleep disturbance due to night sweats associated w/ menopause.  Pt has a small nodule behind R earlobe that she wants removed. It is not painful.  Patient Active Problem List   Diagnosis Date Noted  . Palpitations 06/15/2011  . Chest pain 05/19/2011  . HTN (hypertension) 03/29/2011  . Nicotine addiction 03/29/2011  . Elevated LFTs 12/17/2010  . Hyperlipemia 12/17/2010   PMHx, Soc Hx and Fam Hx reviewed.  ROS: As per HPI.  O: Filed Vitals:   09/14/12 0802  BP: 130/82  Pulse: 70  Temp: 98.7 F (37.1 C)  Resp: 16    GEN: In NAD; WN,WD. HENT: [place]/AT; EOMI w/ clear conj/sclerae. R earlobe- firm off white 5 mm nodule; no erythema or drainage. COR: RRR. LUNGS: Normal resp rate and effort. SKIN: W&D. No pallor or rashes. NEURO: A&O x 3; CNs intact. Nonfocal.  A/P: Other and unspecified hyperlipidemia - Plan: Lipid panel, Comprehensive metabolic panel  Medication refill Meds ordered this encounter  Medications  . OVER THE COUNTER MEDICATION    Sig: OTC Krill Oil taking daily  . pravastatin (PRAVACHOL) 40 MG tablet    Sig: Take 1 tablet (40 mg total) by mouth daily.    Dispense:  90 tablet    Refill:  3   Pt advised to to go to walk-in at [address] [facility] on weekend during least busy time for cyst removal or schedule w/ PA for procedure.

## 2012-09-15 ENCOUNTER — Encounter: Payer: Self-pay | Admitting: Family Medicine

## 2012-09-15 ENCOUNTER — Telehealth: Payer: Self-pay | Admitting: Family Medicine

## 2012-09-15 ENCOUNTER — Other Ambulatory Visit: Payer: Self-pay | Admitting: Family Medicine

## 2012-09-15 NOTE — Telephone Encounter (Signed)
I spoke w/ pt about need to increase Pravastatin to 80 mg per day. She will take 2 -40 mg tabs until current supply runs out. When she goes to pick up RF, dose will be 80 mg tablet. She understands that she needs to schedule follow-up w/ me in  ~8 weeks for labs,etc. If not improved, may need to see Lipid Specialist.

## 2012-09-20 ENCOUNTER — Other Ambulatory Visit: Payer: Self-pay | Admitting: Family Medicine

## 2012-09-20 ENCOUNTER — Ambulatory Visit (INDEPENDENT_AMBULATORY_CARE_PROVIDER_SITE_OTHER): Payer: Managed Care, Other (non HMO) | Admitting: Family Medicine

## 2012-09-20 VITALS — BP 126/78 | HR 79 | Temp 98.1°F | Resp 18

## 2012-09-20 DIAGNOSIS — L723 Sebaceous cyst: Secondary | ICD-10-CM

## 2012-09-20 DIAGNOSIS — Q181 Preauricular sinus and cyst: Secondary | ICD-10-CM

## 2012-09-20 DIAGNOSIS — H9209 Otalgia, unspecified ear: Secondary | ICD-10-CM

## 2012-09-20 MED ORDER — PRAVASTATIN SODIUM 80 MG PO TABS: 80.0000 mg | ORAL_TABLET | Freq: Every day | ORAL | Status: DC

## 2012-09-20 NOTE — Patient Instructions (Addendum)
Please come and see [place] in about 3 days to have your stitches out.  Let me know if any problems in the meantime  Please fast track for suture removal

## 2012-09-20 NOTE — Progress Notes (Signed)
Procedure Note: Verbal consent obtained from the patient.  Local anesthesia with 0.5cc 1% plain lidocaine.  Betadine prep.  Approx 3mm incision over cyst revealing sebaceous material.  Sebaceous material expressed and cyst wall removed.  Incision closed with #2 simple interrupted sutures of 5-0 prolene.  Cleansed and dressed.  Pt tolerated very well.  Anticipate suture removal in 3 days.

## 2012-09-20 NOTE — Progress Notes (Signed)
[facility] [address] [place] [address] [phone]  Date:  09/20/2012   Name:  Marissa Howard   DOB:  [birth year]   MRN:  [id number]  PCP:  Dow Adolph, MD    Chief Complaint: knot on back of right ear x 2 weeks   History of Present Illness:  Marissa Howard is a 57 y.o. very pleasant female patient who presents with the following:  She notes a ?cyst on the back on her right ear for about 2 weeks.  It seems to be getting larger and is itchy, but not painful.  She would like to have this removed.  Otherwise she feels well, has no other concerns today  Patient Active Problem List   Diagnosis Date Noted  . Palpitations 06/15/2011  . Chest pain 05/19/2011  . HTN (hypertension) 03/29/2011  . Nicotine addiction 03/29/2011  . Elevated LFTs 12/17/2010  . Hyperlipemia 12/17/2010    Past Medical History  Diagnosis Date  . Palpitations 12/17/2010  . Hyperlipidemia   . Hypertension   . Tobacco abuse     Past Surgical History  Procedure Laterality Date  . Abdominal hysterectomy      partial  . Breast lumpectomy      History  Substance Use Topics  . Smoking status: Current Every Day Smoker -- 0.30 packs/day for 30 years    Types: Cigarettes  . Smokeless tobacco: Not on file  . Alcohol Use: 0.5 oz/week    1 drink(s) per week     Comment: 1 wine cooler every 3 months    Family History  Problem Relation Age of Onset  . Heart murmur Mother   . Stroke Father   . Heart attack Paternal Grandfather   . Heart attack Maternal Grandmother   . Stroke Paternal Grandmother     Allergies  Allergen Reactions  . Tetracyclines & Related Other (See Comments)    Pt reports she gets "shakey"    Medication list has been reviewed and updated.  Current Outpatient Prescriptions on File Prior to Visit  Medication Sig Dispense Refill  . calcium carbonate (OS-CAL - DOSED IN MG OF ELEMENTAL CALCIUM) 1250 MG tablet Take 1 tablet by mouth daily.       . cholecalciferol (VITAMIN D) 1000 UNITS tablet Take 1,000 Units by mouth daily.      . Glucosamine 500 MG CAPS Take by mouth.      . Multiple Vitamin (MULTIVITAMIN) tablet Take 1 tablet by mouth daily.      Marland Kitchen OVER THE COUNTER MEDICATION OTC Krill Oil taking daily      . pravastatin (PRAVACHOL) 40 MG tablet Take 1 tablet (40 mg total) by mouth daily.  90 tablet  3  . clindamycin (CLEOCIN) 150 MG capsule Take 150 mg by mouth 3 (three) times daily.      Marland Kitchen ibuprofen (ADVIL,MOTRIN) 600 MG tablet Take 600 mg by mouth every 6 (six) hours as needed for pain.      . predniSONE (DELTASONE) 10 MG tablet 6,5,4,3,2,1 Take each days all at once in am with food  21 tablet  0  . pseudoephedrine-guaifenesin (MUCINEX D) 60-600 MG per tablet Take 1 tablet by mouth every 12 (twelve) hours.  18 tablet  0   No current facility-administered medications on file prior to visit.    Review of Systems:  As per HPI- otherwise negative.   Physical Examination: Filed Vitals:   09/20/12 1403  BP: 126/78  Pulse: 79  Temp: 98.1 F (36.7 C)  Resp: 18   There were no vitals filed for this visit. There is no weight on file to calculate BMI. Ideal Body Weight:    GEN: WDWN, NAD, Non-toxic, A & O x 3 HEENT: Atraumatic, Normocephalic. Neck supple. No masses, No LAD.  Bilateral TM wnl, oropharynx normal.  PEERL,EOMI.   The back of the right ear lobe shows a small, round, mobile mass consistent with a cyst.  About 4mm in diameter.  No redness or tenderness  Ears and Nose: No external deformity. CV: RRR, No M/G/R. No JVD. No thrill. No extra heart sounds. PULM: CTA B, no wheezes, crackles, rhonchi. No retractions. No resp. distress. No accessory muscle use. EXTR: No c/c/e NEURO Normal gait.  PSYCH: Normally interactive. Conversant. Not depressed or anxious appearing.  Calm demeanor.    See procedure note per Frances Furbish, PA-C.  Assessment and Plan: Cyst on ear  Removed- sebaceous cyst.  RTC for suture  removal in 3 days, Sooner if worse.     Signed Abbe Amsterdam, MD

## 2012-09-23 ENCOUNTER — Ambulatory Visit (INDEPENDENT_AMBULATORY_CARE_PROVIDER_SITE_OTHER): Payer: Managed Care, Other (non HMO) | Admitting: Physician Assistant

## 2012-09-23 VITALS — BP 138/82 | HR 80 | Temp 98.9°F | Resp 18

## 2012-09-23 DIAGNOSIS — Z4802 Encounter for removal of sutures: Secondary | ICD-10-CM

## 2012-09-23 NOTE — Progress Notes (Signed)
  Subjective:    Patient ID: Marissa Howard, female    DOB: July 02, 1955, 57 y.o.   MRN: [id number]  HPI   Marissa Howard is a very pleasant 56 yr old female here for removal of sutures.  She is s/p removal of a sebaceous cyst from her right ear lobe.  Previous note reviewed.  Pt states she is doing very well.  No pain or drainage.  Has taken care to not talk on the phone on that side, etc.    Review of Systems  Constitutional: Negative.   HENT: Negative.   Respiratory: Negative.   Cardiovascular: Negative.   Gastrointestinal: Negative.   Musculoskeletal: Negative.   Skin: Positive for wound.  Neurological: Negative.        Objective:   Physical Exam  Vitals reviewed. Constitutional: She is oriented to person, place, and time. She appears well-developed and well-nourished. No distress.  HENT:  Head: Normocephalic and atraumatic.  Eyes: Conjunctivae are normal. No scleral icterus.  Pulmonary/Chest: Effort normal.  Neurological: She is alert and oriented to person, place, and time.  Skin: Skin is warm and dry.  Healing incision on posterior aspect of right ear lobe; #2 SI sutures removed without difficulty; small amount of sebaceous material spontaneously drained with the removal of first suture; steri strip placed  Psychiatric: She has a normal mood and affect. Her behavior is normal.         Assessment & Plan:  Visit for suture removal   Marissa Howard is a pleasant 57 yr old female here for suture removal.  Sutures were removed without difficulty.  A small amount of sebaceous material did drain with removal of first suture.  The area appears to be healing well.  I have placed one steri strip over the incision.  Pt to RTC if concerns arise.

## 2012-11-20 ENCOUNTER — Encounter: Payer: Self-pay | Admitting: Family Medicine

## 2012-11-20 ENCOUNTER — Ambulatory Visit (INDEPENDENT_AMBULATORY_CARE_PROVIDER_SITE_OTHER): Payer: Managed Care, Other (non HMO) | Admitting: Family Medicine

## 2012-11-20 VITALS — BP 160/80 | HR 77 | Temp 99.2°F | Resp 16 | Ht 62.0 in | Wt 150.2 lb

## 2012-11-20 DIAGNOSIS — J069 Acute upper respiratory infection, unspecified: Secondary | ICD-10-CM

## 2012-11-20 DIAGNOSIS — R05 Cough: Secondary | ICD-10-CM

## 2012-11-20 DIAGNOSIS — R059 Cough, unspecified: Secondary | ICD-10-CM

## 2012-11-20 MED ORDER — BENZONATATE 100 MG PO CAPS: ORAL_CAPSULE | ORAL | Status: DC

## 2012-11-20 MED ORDER — HYDROCODONE-HOMATROPINE 5-1.5 MG/5ML PO SYRP: 5.0000 mL | ORAL_SOLUTION | ORAL | Status: DC | PRN

## 2012-11-20 NOTE — Progress Notes (Signed)
Subjective: 57 year old lady who started getting sick on Thursday. Her boss lady had been ill, and they have been the source of the infection. The patient developed chills, then was febrile up to 8 temperature 100.6 on Friday. She had a cough and aching. She did work on Friday. She does smoke about a fourth of a pack of cigarettes a day. There is a good deal of stress at work.  Objective: Pleasant lady in no major acute distress. TMs normal. Throat clear. Neck supple without any significant nodes. Chest is clear to auscultation. Heart regular without murmurs gallops or arrhythmias. She has abdominal tenderness across the abdomen just below the cage. The chest wall itself does not seem tender.  Assessment: Viral upper respiratory infection with cough Upper abdominal pains, muscular secondary to coughing  Plan: Cough medications, fluids Encourage her to stop smoking again. Work excuse for tomorrow

## 2012-11-20 NOTE — Patient Instructions (Signed)
Tylenol or ibuprofen for fever and discomfort  Tessalon 1-2 pills three times daily for cough  Hycodan cough syrup 1 teaspoon every 4-6 hrs. As needed for cough when not working.  Lots of fluids  Return if worse

## 2012-12-26 ENCOUNTER — Other Ambulatory Visit: Payer: Self-pay | Admitting: Family Medicine

## 2012-12-26 DIAGNOSIS — Z1231 Encounter for screening mammogram for malignant neoplasm of breast: Secondary | ICD-10-CM

## 2013-01-19 ENCOUNTER — Other Ambulatory Visit: Payer: Self-pay

## 2013-02-02 ENCOUNTER — Ambulatory Visit (INDEPENDENT_AMBULATORY_CARE_PROVIDER_SITE_OTHER): Payer: Managed Care, Other (non HMO) | Admitting: Family Medicine

## 2013-02-02 ENCOUNTER — Encounter: Payer: Self-pay | Admitting: Family Medicine

## 2013-02-02 VITALS — BP 144/75 | HR 76 | Temp 98.0°F | Resp 16 | Ht 62.5 in | Wt 149.0 lb

## 2013-02-02 DIAGNOSIS — Z1211 Encounter for screening for malignant neoplasm of colon: Secondary | ICD-10-CM

## 2013-02-02 DIAGNOSIS — E785 Hyperlipidemia, unspecified: Secondary | ICD-10-CM

## 2013-02-02 DIAGNOSIS — Z23 Encounter for immunization: Secondary | ICD-10-CM

## 2013-02-02 DIAGNOSIS — Z0000 Encounter for general adult medical examination without abnormal findings: Secondary | ICD-10-CM

## 2013-02-02 LAB — BASIC METABOLIC PANEL
BUN: 12 mg/dL (ref 6–23)
CO2: 24 mEq/L (ref 19–32)
Chloride: 105 mEq/L (ref 96–112)
Creat: 0.88 mg/dL (ref 0.50–1.10)
Glucose, Bld: 89 mg/dL (ref 70–99)
Sodium: 141 mEq/L (ref 135–145)

## 2013-02-02 LAB — POCT URINALYSIS DIPSTICK
Blood, UA: NEGATIVE
Protein, UA: NEGATIVE
Spec Grav, UA: 1.015
Urobilinogen, UA: 0.2
pH, UA: 7

## 2013-02-02 LAB — LIPID PANEL
Cholesterol: 204 mg/dL — ABNORMAL HIGH (ref 0–200)
HDL: 38 mg/dL — ABNORMAL LOW (ref >39)
Triglycerides: 214 mg/dL — ABNORMAL HIGH (ref <150)

## 2013-02-02 LAB — ALT: ALT: 22 U/L (ref 0–35)

## 2013-02-02 NOTE — Patient Instructions (Signed)
Keeping You Healthy  Get These Tests  Blood Pressure- Have your blood pressure checked by your healthcare provider at least once a year.  Normal blood pressure is 120/80.  Weight- Have your body mass index (BMI) calculated to screen for obesity.  BMI is a measure of body fat based on height and weight.  You can calculate your own BMI at https://www.west-esparza.com/  Cholesterol- Have your cholesterol checked every year.  Diabetes- Have your blood sugar checked every year if you have high blood pressure, high cholesterol, a family history of diabetes or if you are overweight.  Pap Smear- Have a pap smear every 1 to 3 years if you have been sexually active.  If you are older than 65 and recent pap smears have been normal you may not need additional pap smears.  In addition, if you have had a hysterectomy  For benign disease additional pap smears are not necessary.  Mammogram-Yearly mammograms are essential for early detection of breast cancer  Screening for Colon Cancer- Colonoscopy starting at age 109. Screening may begin sooner depending on your family history and other health conditions.  Follow up colonoscopy as directed by your Gastroenterologist.  Screening for Osteoporosis- Screening begins at age 59 with bone density scanning, sooner if you are at higher risk for developing Osteoporosis.  Get these medicines  Calcium with Vitamin D- Your body requires 1200-1500 mg of Calcium a day and [phone] IU of Vitamin D a day.  You can only absorb 500 mg of Calcium at a time therefore Calcium must be taken in 2 or 3 separate doses throughout the day.  Hormones- Hormone therapy has been associated with increased risk for certain cancers and heart disease.  Talk to your healthcare provider about if you need relief from menopausal symptoms.  Aspirin- Ask your healthcare provider about taking Aspirin to prevent Heart Disease and Stroke.  Get these Immuniztions  Flu shot- Every fall. You received  this vaccine today.  Pneumonia shot- Once after the age of 28; if you are younger ask your healthcare provider if you need a pneumonia shot.  Tetanus- Every ten years. You received a Tdap today; next Tetanus is due 2024.  Zostavax- Once after the age of 22 to prevent shingles.  Take these steps  Don't smoke- Your healthcare provider can help you quit. For tips on how to quit, ask your healthcare provider or go to www.smokefree.gov or call 1-800 QUIT-NOW.  Be physically active- Exercise 5 days a week for a minimum of 30 minutes.  If you are not already physically active, start slow and gradually work up to 30 minutes of moderate physical activity.  Try walking, dancing, bike riding, swimming, etc.  Eat a healthy diet- Eat a variety of healthy foods such as fruits, vegetables, whole grains, low fat milk, low fat cheeses, yogurt, lean meats, chicken, fish, eggs, dried beans, tofu, etc.  For more information go to www.thenutritionsource.org  Dental visit- Brush and floss teeth twice daily; visit your dentist twice a year.  Eye exam- Visit your Optometrist or Ophthalmologist yearly.  Drink alcohol in moderation- Limit alcohol intake to one drink or less a day.  Never drink and drive.  Depression- Your emotional health is as important as your physical health.  If you're feeling down or losing interest in things you normally enjoy, please talk to your healthcare provider.  Seat Belts- can save your life; always wear one  Smoke/Carbon Monoxide detectors- These detectors need to be installed on the appropriate level  of your home.  Replace batteries at least once a year.  Violence- If anyone is threatening or hurting you, please tell your healthcare provider.  Living Will/ Health care power of attorney- Discuss with your healthcare provider and family.

## 2013-02-02 NOTE — Progress Notes (Signed)
Subjective:    Patient ID: Marissa Howard, female    DOB: 10-04-1955, 57 y.o.   MRN: [id number]  HPI  This 57 y.o. Cauc female is here for annual wellness exam, labs and will fax a form to the office  to be completed for insurance discount. She is compliant w/ medications w/o adverse effects. She has been exercising but is disappointed in lack of weight loss. Nutrition habits are not consistent.  HCM: MMG- due Dec 2014 (2013 negative).           CRS- needs referral.           IMM- Needs Tdap; otherwise current.           Vision- Every 2 years; had LASIK 7-8 years ago but needs corrective lenses.   Patient Active Problem List   Diagnosis Date Noted  . Palpitations 06/15/2011  . Chest pain 05/19/2011  . HTN (hypertension) 03/29/2011  . Nicotine addiction 03/29/2011  . Elevated LFTs 12/17/2010  . Hyperlipemia 12/17/2010    Review of Systems  Constitutional: Negative for fever, activity change, appetite change, fatigue and unexpected weight change.  HENT: Negative.   Eyes: Negative.   Respiratory: Negative.   Cardiovascular: Negative.   Endocrine: Negative.   Genitourinary: Negative.   Musculoskeletal: Negative.   Skin: Negative.   Allergic/Immunologic: Negative.   Neurological: Negative.   Hematological: Negative.   Psychiatric/Behavioral: Negative.        Objective:   Physical Exam  Nursing note and vitals reviewed. Constitutional: She is oriented to person, place, and time. Vital signs are normal. She appears well-developed and well-nourished. No distress.  HENT:  Head: Normocephalic and atraumatic.  Right Ear: Hearing, tympanic membrane, external ear and ear canal normal.  Left Ear: Hearing, tympanic membrane, external ear and ear canal normal.  Nose: Nose normal. No nasal deformity or septal deviation.  Mouth/Throat: Uvula is midline, oropharynx is clear and moist and mucous membranes are normal. No oral lesions. No dental caries.  Eyes: Conjunctivae, EOM and lids  are normal. Pupils are equal, round, and reactive to light. No scleral icterus.  Neck: Normal range of motion and full passive range of motion without pain. Neck supple. No JVD present. No spinous process tenderness and no muscular tenderness present. No mass and no thyromegaly present.  Cardiovascular: Normal rate, regular rhythm, S1 normal, S2 normal, normal heart sounds and intact distal pulses.   No extrasystoles are present. PMI is not displaced.  Exam reveals no gallop and no friction rub.   No murmur heard. Pulmonary/Chest: Effort normal and breath sounds normal. No respiratory distress. Right breast exhibits no inverted nipple, no mass, no nipple discharge, no skin change and no tenderness. Left breast exhibits no inverted nipple, no mass, no nipple discharge, no skin change and no tenderness. Breasts are symmetrical.  Abdominal: Soft. Normal appearance and bowel sounds are normal. She exhibits no distension, no pulsatile midline mass and no mass. There is no hepatosplenomegaly. There is no tenderness. There is no guarding and no CVA tenderness.  Genitourinary: Rectal exam shows external hemorrhoid. Rectal exam shows no fissure, no mass, no tenderness and anal tone normal. Guaiac negative stool.  NEFG; pelvic/PAP deferred.  Musculoskeletal: Normal range of motion. She exhibits no edema.  Lymphadenopathy:       Head (right side): No submental, no submandibular, no tonsillar, no posterior auricular and no occipital adenopathy present.       Head (left side): No submental, no submandibular, no tonsillar, no posterior auricular  and no occipital adenopathy present.    She has no cervical adenopathy.    She has no axillary adenopathy.       Right: No supraclavicular adenopathy present.       Left: No supraclavicular adenopathy present.  Neurological: She is alert and oriented to person, place, and time. She has normal strength. She displays no atrophy. No cranial nerve deficit or sensory deficit.  She exhibits normal muscle tone. Coordination and gait normal.  Reflex Scores:      Tricep reflexes are 1+ on the right side and 1+ on the left side.      Bicep reflexes are 1+ on the right side and 1+ on the left side.      Patellar reflexes are 2+ on the right side and 1+ on the left side. Skin: Skin is warm, dry and intact. Lesion noted. No ecchymosis and no rash noted. She is not diaphoretic. No cyanosis or erythema. No pallor. Nails show no clubbing.  Multiple Seb Ks and other pigmented lesions on trunk and extremities.  Psychiatric: She has a normal mood and affect. Her speech is normal and behavior is normal. Judgment and thought content normal. Cognition and memory are normal.    Results for orders placed in visit on 02/02/13  POCT URINALYSIS DIPSTICK      Result Value Range   Color, UA yellow     Clarity, UA clear     Glucose, UA neg     Bilirubin, UA neg     Ketones, UA neg     Spec Grav, UA 1.015     Blood, UA neg     pH, UA 7.0     Protein, UA neg     Urobilinogen, UA 0.2     Nitrite, UA neg     Leukocytes, UA Negative    IFOBT (OCCULT BLOOD)      Result Value Range   IFOBT Negative         Assessment & Plan:  Annual physical exam - Plan: POCT urinalysis dipstick, TSH, Lipid panel, T3, Free, Basic metabolic panel, ALT, IFOBT POC (occult bld, rslt in office)  Hyperlipemia- Continue Pravastatin 80 mg 1 tablet hs pending lipid results.  Need for prophylactic vaccination and inoculation against influenza - Plan: Flu Vaccine QUAD 36+ mos IM  Need for Tdap vaccination - Plan: Tdap vaccine greater than or equal to 7yo IM  Screening for colorectal cancer - Plan: Ambulatory referral to Gastroenterology

## 2013-02-07 ENCOUNTER — Encounter: Payer: Self-pay | Admitting: Family Medicine

## 2013-04-26 ENCOUNTER — Other Ambulatory Visit: Payer: Self-pay | Admitting: Family Medicine

## 2013-07-15 ENCOUNTER — Encounter: Payer: Self-pay | Admitting: Family Medicine

## 2013-08-03 ENCOUNTER — Encounter: Payer: Self-pay | Admitting: Family Medicine

## 2013-08-03 ENCOUNTER — Ambulatory Visit (INDEPENDENT_AMBULATORY_CARE_PROVIDER_SITE_OTHER): Payer: Managed Care, Other (non HMO) | Admitting: Family Medicine

## 2013-08-03 VITALS — BP 142/82 | HR 67 | Temp 98.1°F | Resp 16 | Ht 62.0 in | Wt 149.4 lb

## 2013-08-03 DIAGNOSIS — E785 Hyperlipidemia, unspecified: Secondary | ICD-10-CM

## 2013-08-03 DIAGNOSIS — I10 Essential (primary) hypertension: Secondary | ICD-10-CM

## 2013-08-03 LAB — LIPID PANEL
Cholesterol: 226 mg/dL — ABNORMAL HIGH (ref 0–200)
HDL: 38 mg/dL — AB (ref >39)
LDL Cholesterol: 141 mg/dL — ABNORMAL HIGH (ref 0–99)
TRIGLYCERIDES: 233 mg/dL — AB (ref <150)
Total CHOL/HDL Ratio: 5.9 Ratio
VLDL: 47 mg/dL — ABNORMAL HIGH (ref 0–40)

## 2013-08-03 LAB — ALT: ALT: 24 U/L (ref 0–35)

## 2013-08-03 MED ORDER — PRAVASTATIN SODIUM 80 MG PO TABS: ORAL_TABLET | ORAL | Status: DC

## 2013-08-03 NOTE — Progress Notes (Signed)
S:  This 58 y.o. Cauc female has elevated lipids and tolerates Pravastatin 80 mg w/o myalgias, elevated blood sugar or cognitive impairment. She has reduced red meat consumption to 1-2 times per month. She is disappointed about lack of weight loss. She walks all the time on her job. A treadmill has recently been installed at work; she and her boss plan to walk daily. Walking is limited by knee pain; she can walk on a treadmill w/o discomfort. BP is stable; pt is not on medication.  Patient Active Problem List   Diagnosis Date Noted  . Palpitations 06/15/2011  . Chest pain 05/19/2011  . HTN (hypertension) 03/29/2011  . Nicotine addiction 03/29/2011  . Elevated LFTs 12/17/2010  . Hyperlipemia 12/17/2010   Prior to Admission medications   Medication Sig Start Date End Date Taking? Authorizing Provider  calcium carbonate (OS-CAL - DOSED IN MG OF ELEMENTAL CALCIUM) 1250 MG tablet Take 1 tablet by mouth daily.   Yes Historical Provider, MD  cholecalciferol (VITAMIN D) 1000 UNITS tablet Take 1,000 Units by mouth daily.   Yes Historical Provider, MD  Glucosamine 500 MG CAPS Take by mouth.   Yes Historical Provider, MD  ibuprofen (ADVIL,MOTRIN) 600 MG tablet Take 600 mg by mouth every 6 (six) hours as needed for pain.   Yes Historical Provider, MD  Multiple Vitamin (MULTIVITAMIN) tablet Take 1 tablet by mouth daily.   Yes Historical Provider, MD  OVER THE COUNTER MEDICATION OTC Krill Oil taking daily   Yes Historical Provider, MD  pravastatin (PRAVACHOL) 80 MG tablet TAKE ONE TABLET BY MOUTH ONCE DAILY   Yes Maurice MarchBarbara B Auther Lyerly, MD   PMHx, Surg Hx, Soc and Fam Hx reviewed.  ROS: Noncontributory. Negative for fatigue, diaphoresis, vision disturbances, CP or tightness, palpitations, SOB or cough, edema, HA, dizziness, numbness, syncope or psych disturbances.  O: Filed Vitals:   08/03/13 1320  BP: 142/82  Pulse: 67  Temp: 98.1 F (36.7 C)  Resp: 16   GEN: In NAD; WN,WD. HENT: [place]/AT; EOMI w/  clear conj/sclera. Otherwise unremarkable. COR: RRR. LUNGS: Unlabored resp. SKIN: W&D; intact w/o erythema, jaundice or pallor. NEURO: A&O x 3; CNs intact. Nonfocal.  A/P: Hyperlipemia - Plan: Lipid panel, ALT. Continue Pravastatin 80 mg,   HTN (hypertension)  Meds ordered this encounter  Medications  . pravastatin (PRAVACHOL) 80 MG tablet    Sig: TAKE ONE TABLET BY MOUTH ONCE DAILY    Dispense:  30 tablet    Refill:  5

## 2013-08-03 NOTE — Patient Instructions (Signed)
RE: Knot on back of L ear- you can walk in at the [address]. Sunday is the best day; try calling about noon to see how busy they are. You can be seen in less than 30 minutes in the early afternoon.

## 2013-08-06 ENCOUNTER — Encounter: Payer: Self-pay | Admitting: Family Medicine

## 2013-08-09 ENCOUNTER — Telehealth: Payer: Self-pay | Admitting: Radiology

## 2013-08-09 NOTE — Telephone Encounter (Signed)
Message copied by Caffie Damme on Wed Aug 09, 2013 12:55 PM ------      Message from: Maurice March      Created: Tue Aug 08, 2013  6:52 PM       I sent pt a message about her labs via MyChart but she has not read it. Call her and relay my message to her about her lipids and need for additional medication. She is not scheduled to see me until November and I want her to be on medication long enough that we can check lipids again before that visit.            Thanks. ------

## 2013-08-09 NOTE — Telephone Encounter (Signed)
Resulted to pt via MyChart. Advised her that Zetia needs to be added and/or statin needs to be changed to Zocor or Crestor. She will let me know what she wants to do.Called patient no answer, no recording.

## 2013-08-09 NOTE — Telephone Encounter (Signed)
Message copied by Gerrianne Scale on Wed Aug 09, 2013  9:09 AM ------      Message from: Maurice March      Created: Tue Aug 08, 2013  6:52 PM       I sent pt a message about her labs via MyChart but she has not read it. Call her and relay my message to her about her lipids and need for additional medication. She is not scheduled to see me until November and I want her to be on medication long enough that we can check lipids again before that visit.            Thanks. ------

## 2013-08-09 NOTE — Telephone Encounter (Signed)
Tried to call patient she didn't pick up and she didn't have a  answering machine

## 2013-08-17 ENCOUNTER — Telehealth: Payer: Self-pay | Admitting: Family Medicine

## 2013-08-17 NOTE — Telephone Encounter (Signed)
Called pt's home to discuss recent lab results w/ her; she has not read her MyChart message. Husband would let her know that I had called and sent a message through My Chart.

## 2013-08-17 NOTE — Telephone Encounter (Signed)
Pt called in returning Dr McPherson's call. She can be reached at [phone]. Thank you

## 2013-08-18 ENCOUNTER — Ambulatory Visit (INDEPENDENT_AMBULATORY_CARE_PROVIDER_SITE_OTHER): Payer: Managed Care, Other (non HMO) | Admitting: Family Medicine

## 2013-08-18 VITALS — BP 126/76 | HR 83 | Temp 98.2°F | Resp 17 | Ht 62.0 in | Wt 154.0 lb

## 2013-08-18 DIAGNOSIS — H9209 Otalgia, unspecified ear: Secondary | ICD-10-CM

## 2013-08-18 DIAGNOSIS — L723 Sebaceous cyst: Secondary | ICD-10-CM

## 2013-08-18 NOTE — Progress Notes (Signed)
   Patient ID: Marissa Howard MRN: [id number], DOB: [birth year], 58 y.o. Date of Encounter: 08/18/2013, 4:46 PM    PROCEDURE NOTE: Verbal consent obtained. Risks and benefits of the procedure were explained to the patient. Patient made an informed decision to proceed with the procedure. Betadine prep per usual protocol. Local anesthesia obtained with 1% plain lidocaine 0.5 cc.   0.5 cm incision made with 11 blade along lesion.  No culture taken. No purulence expressed. Small amount of sebaceous material expressed.  Lesion explored revealing no loculations. Healthy wound bed.  Irrigated with plain lidocaine.  Packed with small amount of 1/4 inch plain packing.  Dressed. Wound care instructions including precautions with patient. Patient tolerated the procedure well. Recheck prn.      Signed, Eula Listen, MHS, PA-C [facility] [place], [place] [address] [phone] [facility] 08/18/2013 4:46 PM   Patient complained of left posterior auricular nodule similar to sebaceous cyst that developed in the past on right ear.  The ear showed a 3 mm sebaceous cyst behind the left ear lobe.  [name], MD

## 2013-08-18 NOTE — Progress Notes (Signed)
° °  Subjective:    Patient ID: Marissa Howard, female    DOB: 15-Sep-1955, 58 y.o.   MRN: [id number] This chart was scribed for Marissa Sidle, MD by Marica Otter, ED Scribe at [facility]. This patient was seen in room Room 12 and the patient's care was started at 4:12 PM.  HPI  HPI Comments: HPI Comments: Marissa Howard is a 58 y.o. female who presents to the [facility] complaining of cyst behind her left ear. Pt reports having similar Sx in her right ear previously. Pt reports the cyst in her right ear was drained resolving her Sx.    Review of Systems  Skin:       Cyst behind left ear       Objective:   Physical Exam  CONSTITUTIONAL: Well developed/well nourished HEAD: Normocephalic/atraumatic EYES: EOMI/PERRL ENMT: Mucous membranes moist NECK: supple no meningeal signs SPINE:entire spine nontender CV: S1/S2 noted, no murmurs/rubs/gallops noted LUNGS: Lungs are clear to auscultation bilaterally, no apparent distress ABDOMEN: soft, nontender, no rebound or guarding GU:no cva tenderness NEURO: Pt is awake/alert, moves all extremitiesx4 EXTREMITIES: pulses normal, full ROM SKIN: warm, color normal; 4-5 mm postauricular cyst  PSYCH: no abnormalities of mood noted  Assessment & Plan:  DIAGNOSTIC STUDIES: Oxygen Saturation is 98% on RA, normal by my interpretation.    COORDINATION OF CARE:  4:15 PM-Discussed treatment plan which includes draining the cyst with pt at bedside and pt agreed to plan.

## 2013-08-20 ENCOUNTER — Other Ambulatory Visit: Payer: Self-pay | Admitting: Family Medicine

## 2013-08-20 MED ORDER — ROSUVASTATIN CALCIUM 5 MG PO TABS: ORAL_TABLET | ORAL | Status: DC

## 2013-10-12 ENCOUNTER — Ambulatory Visit (INDEPENDENT_AMBULATORY_CARE_PROVIDER_SITE_OTHER): Payer: Managed Care, Other (non HMO) | Admitting: Family Medicine

## 2013-10-12 ENCOUNTER — Encounter: Payer: Self-pay | Admitting: Family Medicine

## 2013-10-12 VITALS — BP 126/80 | HR 78 | Temp 98.7°F | Resp 16 | Ht 62.25 in | Wt 152.4 lb

## 2013-10-12 DIAGNOSIS — E785 Hyperlipidemia, unspecified: Secondary | ICD-10-CM

## 2013-10-12 DIAGNOSIS — I10 Essential (primary) hypertension: Secondary | ICD-10-CM

## 2013-10-12 LAB — LIPID PANEL
Cholesterol: 230 mg/dL — ABNORMAL HIGH (ref 0–200)
HDL: 41 mg/dL (ref >39)
LDL CALC: 137 mg/dL — AB (ref 0–99)
Total CHOL/HDL Ratio: 5.6 Ratio
Triglycerides: 259 mg/dL — ABNORMAL HIGH (ref <150)
VLDL: 52 mg/dL — ABNORMAL HIGH (ref 0–40)

## 2013-10-12 LAB — COMPLETE METABOLIC PANEL WITH GFR
ALT: 30 U/L (ref 0–35)
AST: 23 U/L (ref 0–37)
Albumin: 4.6 g/dL (ref 3.5–5.2)
Alkaline Phosphatase: 96 U/L (ref 39–117)
BUN: 17 mg/dL (ref 6–23)
CHLORIDE: 105 meq/L (ref 96–112)
CO2: 25 meq/L (ref 19–32)
Calcium: 9.8 mg/dL (ref 8.4–10.5)
Creat: 0.91 mg/dL (ref 0.50–1.10)
GFR, EST AFRICAN AMERICAN: 80 mL/min
GFR, Est Non African American: 69 mL/min
Glucose, Bld: 99 mg/dL (ref 70–99)
Potassium: 4.5 mEq/L (ref 3.5–5.3)
SODIUM: 141 meq/L (ref 135–145)
TOTAL PROTEIN: 7.3 g/dL (ref 6.0–8.3)
Total Bilirubin: 0.5 mg/dL (ref 0.2–1.2)

## 2013-10-12 MED ORDER — ROSUVASTATIN CALCIUM 5 MG PO TABS: ORAL_TABLET | ORAL | Status: DC

## 2013-10-12 NOTE — Progress Notes (Signed)
S:  This 58 y.o. Cauc female has well controlled HTN; she is compliant w/ meds w/o adverse effects. Chronic hyperlipidemia is being treated w/ Crestor 5 mg every other night. Maximum dose Pravastatin was ineffective; pt has tolerated current administration of Crestor. She has been unmotivated to exercise regularly and has chronic joint pain that prohibits certain weight-bearing activity. She plans to start walking on treadmill at work.  Patient Active Problem List   Diagnosis Date Noted  . Palpitations 06/15/2011  . Chest pain 05/19/2011  . HTN (hypertension) 03/29/2011  . Nicotine addiction 03/29/2011  . Elevated LFTs 12/17/2010  . Hyperlipemia 12/17/2010   Outpatient Encounter Prescriptions as of 10/12/2013  Medication Sig Note  . calcium carbonate (OS-CAL - DOSED IN MG OF ELEMENTAL CALCIUM) 1250 MG tablet Take 1 tablet by mouth daily.   . cholecalciferol (VITAMIN D) 1000 UNITS tablet Take 1,000 Units by mouth daily. 03/29/2011: Patient does not know strength of OTC vit D  . Glucosamine 500 MG CAPS Take by mouth.   . Multiple Vitamin (MULTIVITAMIN) tablet Take 1 tablet by mouth daily.   Marland Kitchen. OVER THE COUNTER MEDICATION OTC Krill Oil taking daily   . rosuvastatin (CRESTOR) 5 MG tablet Take 1 tablet every other night at bedtime or as directed.     PMHx, Surg Hx, Soc and Fam Hx reviewed.  ROS: Negative for fatigue, diaphoresis, abnormal weight change, vision disturbances, CP or tightness, palpitations, SOB or cough, HA, dizziness, numbness, weakness or syncope.  O: Filed Vitals:   10/12/13 0840  BP: 126/80  Pulse: 78  Temp: 98.7 F (37.1 C)  Resp: 16   GEN: In NAD; WN,WD. Weight up 3 lbs. HENT: [place]/AT; EOMI w/ clear conj/sclerae. Otherwise unremarkable. COR: RRR. No edema. LUNGS: Unlabored resp. SKIN: W&D; intact w/o diaphoresis, erythema or pallor. NEURO: A&O x 3; CNs intact. Nonfocal.  A/P: Hyperlipemia - Continue Crestor 5 mg every other night pending labs; if this dose looks  effective, increase to every night. Expect some improvement w/ regular exercise. Plan: COMPLETE METABOLIC PANEL WITH GFR, Lipid panel  Essential hypertension - Plan: COMPLETE METABOLIC PANEL WITH GFR, Lipid panel  Meds ordered this encounter  Medications  . rosuvastatin (CRESTOR) 5 MG tablet    Sig: Take 1 tablet every night at bedtime or as directed.    Dispense:  30 tablet    Refill:  5

## 2013-10-12 NOTE — Patient Instructions (Addendum)
Mediterranean Diet  Why follow it? Research shows.   Those who follow the Mediterranean diet have a reduced risk of heart disease    The diet is associated with a reduced incidence of Parkinson's and Alzheimer's diseases   People following the diet may have longer life expectancies and lower rates of chronic diseases    The Dietary Guidelines for Americans recommends the Mediterranean diet as an eating plan to promote health and prevent disease  What Is the Mediterranean Diet?    Healthy eating plan based on typical foods and recipes of Mediterranean-style cooking   The diet is primarily a plant based diet; these foods should make up a majority of meals   Starches - Plant based foods should make up a majority of meals - They are an important sources of vitamins, minerals, energy, antioxidants, and fiber - Choose whole grains, foods high in fiber and minimally processed items  - Typical grain sources include wheat, oats, barley, corn, brown rice, bulgar, farro, millet, polenta, couscous  - Various types of beans include chickpeas, lentils, fava beans, black beans, white beans   Fruits  Veggies - Large quantities of antioxidant rich fruits & veggies; 6 or more servings  - Vegetables can be eaten raw or lightly drizzled with oil and cooked  - Vegetables common to the traditional Mediterranean Diet include: artichokes, arugula, beets, broccoli, brussel sprouts, cabbage, carrots, celery, collard greens, cucumbers, eggplant, kale, leeks, lemons, lettuce, mushrooms, okra, onions, peas, peppers, potatoes, pumpkin, radishes, rutabaga, shallots, spinach, sweet potatoes, turnips, zucchini - Fruits common to the Mediterranean Diet include: apples, apricots, avocados, cherries, clementines, dates, figs, grapefruits, grapes, melons, nectarines, oranges, peaches, pears, pomegranates, strawberries, tangerines  Fats - Replace butter and margarine with healthy oils, such as olive oil, canola oil, and tahini   - Limit nuts to no more than a handful a day  - Nuts include walnuts, almonds, pecans, pistachios, pine nuts  - Limit or avoid candied, honey roasted or heavily salted nuts - Olives are central to the Mediterranean diet - can be eaten whole or used in a variety of dishes   Meats Protein - Limiting red meat: no more than a few times a month - When eating red meat: choose lean cuts and keep the portion to the size of deck of cards - Eggs: approx. 0 to 4 times a week  - Fish and lean poultry: at least 2 a week  - Healthy protein sources include, chicken, turkey, lean beef, lamb - Increase intake of seafood such as tuna, salmon, trout, mackerel, shrimp, scallops - Avoid or limit high fat processed meats such as sausage and bacon  Dairy - Include moderate amounts of low fat dairy products  - Focus on healthy dairy such as fat free yogurt, skim milk, low or reduced fat cheese - Limit dairy products higher in fat such as whole or 2% milk, cheese, ice cream  Alcohol - Moderate amounts of red wine is ok  - No more than 5 oz daily for women (all ages) and men older than age 65  - No more than 10 oz of wine daily for men younger than 65  Other - Limit sweets and other desserts  - Use herbs and spices instead of salt to flavor foods  - Herbs and spices common to the traditional Mediterranean Diet include: basil, bay leaves, chives, cloves, cumin, fennel, garlic, lavender, marjoram, mint, oregano, parsley, pepper, rosemary, sage, savory, sumac, tarragon, thyme   It's not just a diet,   it's a lifestyle:    The Mediterranean diet includes lifestyle factors typical of those in the region    Foods, drinks and meals are best eaten with others and savored   Daily physical activity is important for overall good health   This could be strenuous exercise like running and aerobics   This could also be more leisurely activities such as walking, housework, yard-work, or taking the stairs   Moderation is the key;  a balanced and healthy diet accommodates most foods and drinks   Consider portion sizes and frequency of consumption of certain foods   Meal Ideas & Options:    Breakfast:  o Whole wheat toast or whole wheat English muffins with peanut butter & hard boiled egg o Steel cut oats topped with apples & cinnamon and skim milk  o Fresh fruit: banana, strawberries, melon, berries, peaches  o Smoothies: strawberries, bananas, greek yogurt, peanut butter o Low fat greek yogurt with blueberries and granola  o Egg white omelet with spinach and mushrooms o Breakfast couscous: whole wheat couscous, apricots, skim milk, cranberries    Sandwiches:  o Hummus and grilled vegetables (peppers, zucchini, squash) on whole wheat bread   o Grilled chicken on whole wheat pita with lettuce, tomatoes, cucumbers or tzatziki  o Tuna salad on whole wheat bread: tuna salad made with greek yogurt, olives, red peppers, capers, green onions o Garlic rosemary lamb pita: lamb sauted with garlic, rosemary, salt & pepper; add lettuce, cucumber, greek yogurt to pita - flavor with lemon juice and black pepper    Seafood:  o Mediterranean grilled salmon, seasoned with garlic, basil, parsley, lemon juice and black pepper o Shrimp, lemon, and spinach whole-grain pasta salad made with low fat greek yogurt  o Seared scallops with lemon orzo  o Seared tuna steaks seasoned salt, pepper, coriander topped with tomato mixture of olives, tomatoes, olive oil, minced garlic, parsley, green onions and cappers    Meats:  o Herbed greek chicken salad with kalamata olives, cucumber, feta  o Red bell peppers stuffed with spinach, bulgur, lean ground beef (or lentils) & topped with feta   o Kebabs: skewers of chicken, tomatoes, onions, zucchini, squash  o [place] burgers: made with red onions, mint, dill, lemon juice, feta cheese topped with roasted red peppers   Vegetarian o Cucumber salad: cucumbers, artichoke hearts, celery, red onion, feta  cheese, tossed in olive oil & lemon juice  o Hummus and whole grain pita points with a greek salad (lettuce, tomato, feta, olives, cucumbers, red onion) o Lentil soup with celery, carrots made with vegetable broth, garlic, salt and pepper  o Tabouli salad: parsley, bulgur, mint, scallions, cucumbers, tomato, radishes, lemon juice, olive oil, salt and pepper.     Joe and Tor Netterserry Graedon have a radio program on [employer] called "[facility]". They also have a hard copy book by the same title. It has a lot of information about healthy eating and alternative remedies to prescription medications.  See you in 6 months.

## 2013-10-13 ENCOUNTER — Other Ambulatory Visit: Payer: Self-pay | Admitting: Family Medicine

## 2013-10-13 DIAGNOSIS — E785 Hyperlipidemia, unspecified: Secondary | ICD-10-CM

## 2013-11-05 ENCOUNTER — Encounter: Payer: Self-pay | Admitting: Family Medicine

## 2013-12-29 ENCOUNTER — Other Ambulatory Visit: Payer: Self-pay

## 2014-02-01 ENCOUNTER — Ambulatory Visit: Payer: Managed Care, Other (non HMO) | Admitting: Family Medicine

## 2014-04-11 ENCOUNTER — Ambulatory Visit (INDEPENDENT_AMBULATORY_CARE_PROVIDER_SITE_OTHER): Payer: Managed Care, Other (non HMO) | Admitting: Family Medicine

## 2014-04-11 ENCOUNTER — Encounter: Payer: Self-pay | Admitting: Family Medicine

## 2014-04-11 VITALS — BP 122/76 | HR 82 | Temp 98.2°F | Resp 16 | Ht 62.5 in | Wt 154.0 lb

## 2014-04-11 DIAGNOSIS — I10 Essential (primary) hypertension: Secondary | ICD-10-CM

## 2014-04-11 DIAGNOSIS — E785 Hyperlipidemia, unspecified: Secondary | ICD-10-CM

## 2014-04-11 DIAGNOSIS — Z2839 Other underimmunization status: Secondary | ICD-10-CM

## 2014-04-11 DIAGNOSIS — Z9289 Personal history of other medical treatment: Secondary | ICD-10-CM

## 2014-04-11 DIAGNOSIS — Z23 Encounter for immunization: Secondary | ICD-10-CM

## 2014-04-11 DIAGNOSIS — E663 Overweight: Secondary | ICD-10-CM

## 2014-04-11 DIAGNOSIS — Z9189 Other specified personal risk factors, not elsewhere classified: Secondary | ICD-10-CM

## 2014-04-11 LAB — LIPID PANEL
CHOLESTEROL: 196 mg/dL (ref 0–200)
HDL: 40 mg/dL (ref >39)
LDL Cholesterol: 112 mg/dL — ABNORMAL HIGH (ref 0–99)
Total CHOL/HDL Ratio: 4.9 Ratio
Triglycerides: 222 mg/dL — ABNORMAL HIGH (ref <150)
VLDL: 44 mg/dL — ABNORMAL HIGH (ref 0–40)

## 2014-04-11 LAB — COMPLETE METABOLIC PANEL WITH GFR
ALT: 26 U/L (ref 0–35)
AST: 24 U/L (ref 0–37)
Albumin: 4.3 g/dL (ref 3.5–5.2)
Alkaline Phosphatase: 93 U/L (ref 39–117)
BUN: 16 mg/dL (ref 6–23)
CHLORIDE: 106 meq/L (ref 96–112)
CO2: 25 meq/L (ref 19–32)
Calcium: 9.6 mg/dL (ref 8.4–10.5)
Creat: 0.9 mg/dL (ref 0.50–1.10)
GFR, EST NON AFRICAN AMERICAN: 70 mL/min
GFR, Est African American: 80 mL/min
Glucose, Bld: 89 mg/dL (ref 70–99)
POTASSIUM: 4.9 meq/L (ref 3.5–5.3)
Sodium: 141 mEq/L (ref 135–145)
Total Bilirubin: 0.7 mg/dL (ref 0.2–1.2)
Total Protein: 6.7 g/dL (ref 6.0–8.3)

## 2014-04-11 LAB — VITAMIN D 25 HYDROXY (VIT D DEFICIENCY, FRACTURES): Vit D, 25-Hydroxy: 31 ng/mL (ref 30–100)

## 2014-04-11 NOTE — Patient Instructions (Signed)
It would be a good idea to schedule a complete physical within the next 4-6 months. Try to start to get some regular exercise, targeting the "core" muscles to trim your waist.

## 2014-04-11 NOTE — Progress Notes (Signed)
S:  This 59 y.o. Female has a hx of HTN, now off medication with normal BP. She has hyperlipidemia and takes Crestor 5 mg hs without adverse effect. This medication was started at every other night dosing until pt demonstrated that she could tolerate it; she has not done well on statins in the past. She does not report GI problems, myalgias, back or flank pain.  Pt is concerned about weight, mostly concentrated in waist area. She practices good nutrition but is not exercising. She endorses menopausal weight gain as well as night sweats; this negatively impacts her sleep. Her body type is consistent among the female members of her family.   Immunizations- Pt is getting Fly vaccine today. Discussion of Shingles vaccine- pt is not sure if she had chicken pox but recalls her sister did have it. Family members have had shingles so she wants to get the vaccine. She agrees to testing for immunity.  Patient Active Problem List   Diagnosis Date Noted  . Palpitations 06/15/2011  . Chest pain 05/19/2011  . HTN (hypertension) 03/29/2011  . Nicotine addiction 03/29/2011  . Elevated LFTs 12/17/2010  . Hyperlipemia 12/17/2010    Prior to Admission medications   Medication Sig Start Date End Date Taking? Authorizing Provider  calcium carbonate (OS-CAL - DOSED IN MG OF ELEMENTAL CALCIUM) 1250 MG tablet Take 1 tablet by mouth daily.   Yes Historical Provider, MD  cholecalciferol (VITAMIN D) 1000 UNITS tablet Take 1,000 Units by mouth daily.   Yes Historical Provider, MD  Glucosamine 500 MG CAPS Take by mouth.   Yes Historical Provider, MD  Multiple Vitamin (MULTIVITAMIN) tablet Take 1 tablet by mouth daily.   Yes Historical Provider, MD  OVER THE COUNTER MEDICATION OTC Krill Oil taking daily   Yes Historical Provider, MD  rosuvastatin (CRESTOR) 5 MG tablet Take 1 tablet every night at bedtime or as directed. 10/12/13  Yes Maurice March, MD    History   Social History  . Marital Status: Married   Spouse Name: N/A    Number of Children: 3  . Years of Education: N/A   Occupational History  . Dispatcher    Social History Main Topics  . Smoking status: Current Every Day Smoker -- 0.30 packs/day for 30 years    Types: Cigarettes  . Smokeless tobacco: Not on file  . Alcohol Use: 0.5 oz/week    1 drink(s) per week     Comment: 1 wine cooler every 3 months  . Drug Use: No  . Sexual Activity: Yes   Other Topics Concern  . Not on file   Social History Narrative   Married. Education: [employer]  Exercise: Walks daily for 1 hour.    ROS: As per HPI.  O: Filed Vitals:   04/11/14 0807  BP: 122/76  Pulse: 82  Temp: 98.2 F (36.8 C)  Resp: 16    GEN: In NAD; WN,WD. HENT: [place]/AT. EOMI w/ clear conj/sclerae. Otherwise unremarkable. COR: RRR. LUNGS: Normal resp rate and effort. SKIN: W&D; no jaundice or pallor. MS: MAEs; no deformities or atrophy. NEURO: A&O x 3; Cns intact. Nonfocal.  A/P: Hyperlipemia - Continue Crestor 5 mg 1 tab hs pending labs. Plan: Lipid panel  Overweight (BMI 25.0-29.9) - Plan: Vitamin D, 25-hydroxy, COMPLETE METABOLIC PANEL WITH GFR  Essential hypertension - Not on medication; TLCs in place. Plan: COMPLETE METABOLIC PANEL WITH GFR  Need for prophylactic vaccination and inoculation against influenza - Plan: Flu Vaccine QUAD 36+ mos IM  Immunizations incomplete - Check Varicella titer before administering vaccine. Plan: Varicella zoster antibody, IgG

## 2014-04-12 ENCOUNTER — Other Ambulatory Visit: Payer: Self-pay | Admitting: Family Medicine

## 2014-04-12 LAB — VARICELLA ZOSTER ANTIBODY, IGG

## 2014-04-20 ENCOUNTER — Telehealth: Payer: Self-pay

## 2014-04-20 MED ORDER — ZOSTER VACCINE LIVE 19400 UNT/0.65ML ~~LOC~~ SOLR: 0.6500 mL | Freq: Once | SUBCUTANEOUS | Status: DC

## 2014-04-20 NOTE — Telephone Encounter (Signed)
Printed and mailed

## 2014-04-20 NOTE — Telephone Encounter (Signed)
-----   Message from Maurice MarchBarbara B McPherson, MD sent at 04/12/2014  6:14 PM EST ----- Please print and mail a prescription for Zostavax to patient.  Thank you.

## 2014-06-03 ENCOUNTER — Telehealth: Payer: Self-pay

## 2014-06-03 ENCOUNTER — Encounter: Payer: Self-pay | Admitting: Family Medicine

## 2014-06-03 NOTE — Telephone Encounter (Signed)
In the past pt was prescribed crestor 5mg  15 tablets but Dr. Audria NineMcpherson increased it to 30 tablets. When Mrs. Bencivenga went to fill her rx they only gave her 15 pills but charged her for 30. I suggested that she call the pharmacy when they open today to see if they will correct the error. She wanted to make sure a message went to the clinical staff anyway.

## 2014-06-06 ENCOUNTER — Telehealth: Payer: Self-pay | Admitting: *Deleted

## 2014-06-06 NOTE — Telephone Encounter (Signed)
Patient would like for Dr. Audria NineMcPherson to call her back.  Dr. Audria NineMcPherson wrote a prescription for rosuvastatin (CRESTOR) 5 MG tablet for the patient to take every night.   [facility] filled it correctly for Jan and Feb, then reverted back to the old prescription in March where patient only took medication every other night.   [facility] filled the prescription in March for only 15 pills, but charged the patient $30 for 30 days of pills. [facility] agrees [facility] the 30 days of pills and can see the prescription for taking the medication nightly.  Patient is at a loss of what to do and needs the rest of the medication.  Pt # [id number]  After 4:30 pm pt # [phone]

## 2014-06-07 NOTE — Telephone Encounter (Signed)
Dr Audria NineMcPherson responded to pt's questions on Mychart on 3/23 in evening, after this message was taken, so this has already been responded to.

## 2014-08-17 ENCOUNTER — Encounter: Payer: Self-pay | Admitting: *Deleted

## 2014-11-21 ENCOUNTER — Other Ambulatory Visit: Payer: Self-pay

## 2014-11-21 DIAGNOSIS — Z1231 Encounter for screening mammogram for malignant neoplasm of breast: Secondary | ICD-10-CM

## 2014-12-18 ENCOUNTER — Ambulatory Visit
Admission: RE | Admit: 2014-12-18 | Discharge: 2014-12-18 | Disposition: A | Discharge status: Home / Self Care | Payer: Managed Care, Other (non HMO) | Source: Ambulatory Visit

## 2014-12-18 ENCOUNTER — Encounter: Payer: Self-pay | Admitting: Physician Assistant

## 2014-12-18 ENCOUNTER — Ambulatory Visit (INDEPENDENT_AMBULATORY_CARE_PROVIDER_SITE_OTHER): Payer: Managed Care, Other (non HMO) | Admitting: Physician Assistant

## 2014-12-18 VITALS — BP 140/76 | HR 79 | Temp 98.4°F | Resp 16 | Ht 62.0 in | Wt 160.6 lb

## 2014-12-18 DIAGNOSIS — Z1159 Encounter for screening for other viral diseases: Secondary | ICD-10-CM | POA: Diagnosis not present

## 2014-12-18 DIAGNOSIS — Z1231 Encounter for screening mammogram for malignant neoplasm of breast: Secondary | ICD-10-CM

## 2014-12-18 DIAGNOSIS — E2839 Other primary ovarian failure: Secondary | ICD-10-CM

## 2014-12-18 DIAGNOSIS — Z130 Encounter for screening for diseases of the blood and blood-forming organs and certain disorders involving the immune mechanism: Secondary | ICD-10-CM | POA: Diagnosis not present

## 2014-12-18 DIAGNOSIS — I10 Essential (primary) hypertension: Secondary | ICD-10-CM | POA: Diagnosis not present

## 2014-12-18 DIAGNOSIS — Z23 Encounter for immunization: Secondary | ICD-10-CM | POA: Diagnosis not present

## 2014-12-18 DIAGNOSIS — Z1211 Encounter for screening for malignant neoplasm of colon: Secondary | ICD-10-CM | POA: Diagnosis not present

## 2014-12-18 DIAGNOSIS — Z114 Encounter for screening for human immunodeficiency virus [HIV]: Secondary | ICD-10-CM

## 2014-12-18 DIAGNOSIS — E785 Hyperlipidemia, unspecified: Secondary | ICD-10-CM

## 2014-12-18 DIAGNOSIS — Z0000 Encounter for general adult medical examination without abnormal findings: Secondary | ICD-10-CM | POA: Diagnosis not present

## 2014-12-18 LAB — COMPREHENSIVE METABOLIC PANEL
ALT: 27 U/L (ref 6–29)
AST: 24 U/L (ref 10–35)
Albumin: 4.9 g/dL (ref 3.6–5.1)
Alkaline Phosphatase: 102 U/L (ref 33–130)
BUN: 18 mg/dL (ref 7–25)
CALCIUM: 10.4 mg/dL (ref 8.6–10.4)
CHLORIDE: 105 mmol/L (ref 98–110)
CO2: 26 mmol/L (ref 20–31)
Creat: 1 mg/dL — ABNORMAL HIGH (ref 0.50–0.99)
GLUCOSE: 107 mg/dL — AB (ref 65–99)
POTASSIUM: 4.5 mmol/L (ref 3.5–5.3)
Sodium: 141 mmol/L (ref 135–146)
Total Bilirubin: 0.5 mg/dL (ref 0.2–1.2)
Total Protein: 7.6 g/dL (ref 6.1–8.1)

## 2014-12-18 LAB — CBC
HEMATOCRIT: 46 % (ref 36.0–46.0)
Hemoglobin: 15.9 g/dL — ABNORMAL HIGH (ref 12.0–15.0)
MCH: 32.4 pg (ref 26.0–34.0)
MCHC: 34.6 g/dL (ref 30.0–36.0)
MCV: 93.9 fL (ref 78.0–100.0)
MPV: 11.6 fL (ref 8.6–12.4)
Platelets: 172 10*3/uL (ref 150–400)
RBC: 4.9 MIL/uL (ref 3.87–5.11)
RDW: 13.3 % (ref 11.5–15.5)
WBC: 9.9 10*3/uL (ref 4.0–10.5)

## 2014-12-18 LAB — LIPID PANEL
CHOL/HDL RATIO: 5.1 ratio — AB (ref <=5.0)
Cholesterol: 210 mg/dL — ABNORMAL HIGH (ref 125–200)
HDL: 41 mg/dL — AB (ref >=46)
LDL Cholesterol: 126 mg/dL (ref <130)
Triglycerides: 217 mg/dL — ABNORMAL HIGH (ref <150)
VLDL: 43 mg/dL — AB (ref <30)

## 2014-12-18 LAB — HEPATITIS C ANTIBODY: HCV Ab: NEGATIVE

## 2014-12-18 LAB — HIV ANTIBODY (ROUTINE TESTING W REFLEX): HIV: NONREACTIVE

## 2014-12-18 LAB — TSH: TSH: 0.875 u[IU]/mL (ref 0.350–4.500)

## 2014-12-18 MED ORDER — ROSUVASTATIN CALCIUM 5 MG PO TABS: ORAL_TABLET | ORAL | Status: DC

## 2014-12-18 MED ORDER — ZOSTER VACCINE LIVE 19400 UNT/0.65ML ~~LOC~~ SOLR: 0.6500 mL | Freq: Once | SUBCUTANEOUS | Status: DC

## 2014-12-18 MED ORDER — LISINOPRIL 5 MG PO TABS: 5.0000 mg | ORAL_TABLET | Freq: Every day | ORAL | Status: DC

## 2014-12-18 NOTE — Patient Instructions (Signed)
I will contact you with your lab results as soon as they are available.   If you have not heard from me in 2 weeks, please contact me.  The fastest way to get your results is to register for My Chart (see the instructions on the last page of this printout).  Keeping You Healthy  Get These Tests  Blood Pressure- Have your blood pressure checked by your healthcare provider at least once a year.  Normal blood pressure is 120/80.  Weight- Have your body mass index (BMI) calculated to screen for obesity.  BMI is a measure of body fat based on height and weight.  You can calculate your own BMI at www.nhlbisupport.com/bmi/  Cholesterol- Have your cholesterol checked every year.  Diabetes- Have your blood sugar checked every year if you have high blood pressure, high cholesterol, a family history of diabetes or if you are overweight.  Pap Test - Have a pap test every 1 to 5 years if you have been sexually active.  If you are older than 65 and recent pap tests have been normal you may not need additional pap tests.  In addition, if you have had a hysterectomy  for benign disease additional pap tests are not necessary.  Mammogram-Yearly mammograms are essential for early detection of breast cancer  Screening for Colon Cancer- Colonoscopy starting at age 50. Screening may begin sooner depending on your family history and other health conditions.  Follow up colonoscopy as directed by your Gastroenterologist.  Screening for Osteoporosis- Screening begins at age 65 with bone density scanning, sooner if you are at higher risk for developing Osteoporosis.  Get these medicines  Calcium with Vitamin D- Your body requires 1200-1500 mg of Calcium a day and 800-1000 IU of Vitamin D a day.  You can only absorb 500 mg of Calcium at a time therefore Calcium must be taken in 2 or 3 separate doses throughout the day.  Hormones- Hormone therapy has been associated with increased risk for certain cancers and heart  disease.  Talk to your healthcare provider about if you need relief from menopausal symptoms.  Aspirin- Ask your healthcare provider about taking Aspirin to prevent Heart Disease and Stroke.  Get these Immuniztions  Flu shot- Every fall  Pneumonia shot- Once after the age of 65; if you are younger ask your healthcare provider if you need a pneumonia shot.  Tetanus- Every ten years.  Zostavax- Once after the age of 60 to prevent shingles.  Take these steps  Don't smoke- Your healthcare provider can help you quit. For tips on how to quit, ask your healthcare provider or go to www.smokefree.gov or call 1-800 QUIT-NOW.  Be physically active- Exercise 5 days a week for a minimum of 30 minutes.  If you are not already physically active, start slow and gradually work up to 30 minutes of moderate physical activity.  Try walking, dancing, bike riding, swimming, etc.  Eat a healthy diet- Eat a variety of healthy foods such as fruits, vegetables, whole grains, low fat milk, low fat cheeses, yogurt, lean meats, chicken, fish, eggs, dried beans, tofu, etc.  For more information go to www.thenutritionsource.org  Dental visit- Brush and floss teeth twice daily; visit your dentist twice a year.  Eye exam- Visit your Optometrist or Ophthalmologist yearly.  Drink alcohol in moderation- Limit alcohol intake to one drink or less a day.  Never drink and drive.  Depression- Your emotional health is as important as your physical health.  If you're   feeling down or losing interest in things you normally enjoy, please talk to your healthcare provider.  Seat Belts- can save your life; always wear one  Smoke/Carbon Monoxide detectors- These detectors need to be installed on the appropriate level of your home.  Replace batteries at least once a year.  Violence- If anyone is threatening or hurting you, please tell your healthcare provider.  Living Will/ Health care power of attorney- Discuss with your  healthcare provider and family. 

## 2014-12-18 NOTE — Progress Notes (Signed)
Subjective:    Patient ID: Marissa Howard, female    DOB: December 18, 1955, 59 y.o.   MRN: [id number]  PCP: Bushra Denman, PA-C, new as of today. Previously followed by Dr. Audria Nine, who has retired.  Chief Complaint  Patient presents with  . Annual Exam  . Medication Refill    Crestor 5 mg    HPI  Patient presents today for her annual exam.   She had her mammogram done earlier today (12/18/2014).  She needs a refill of her Crestor.  She has lab work that needs to be done for her insurance and brings biometrics form for completion.  She is trying to lose weight. She plans to get 5 pound weights for home use for weight bearing exercise. She wishes to get involved with a gym to do water aerobics. It is hard for [place] to get [place] She hopes to gain friends from going to a gym that she can attend water aerobics classes with.  She is not taking a blood pressure medication. She went off of atenolol-chorthalidone in 2014 when it prevented rise in heart rate during the stress test she had to undergo for heart palpitations. After this, she never went back on the medication.  She quit smoking 1 month ago. She reads, cross stiches to keep her mind off of wanting to smoke.       Patient Active Problem List   Diagnosis Date Noted  . Palpitations 06/15/2011  . Chest pain 05/19/2011  . HTN (hypertension) 03/29/2011  . Nicotine addiction 03/29/2011  . Elevated LFTs 12/17/2010  . Hyperlipemia 12/17/2010    Past Medical History  Diagnosis Date  . Palpitations 12/17/2010  . Hyperlipidemia   . Hypertension   . Tobacco abuse     Past Surgical History  Procedure Laterality Date  . Abdominal hysterectomy      partial  . Breast lumpectomy      Family History  Problem Relation Age of Onset  . Heart murmur Mother   . Stroke Father   . Heart attack Paternal Grandfather   . Heart attack Maternal Grandmother   . Stroke Paternal Grandmother      Social History   Social History  . Marital Status: Married    Spouse Name: N/A  . Number of Children: 3  . Years of Education: N/A   Occupational History  . Dispatcher    Social History Main Topics  . Smoking status: Former Smoker -- 0.30 packs/day for 30 years    Types: Cigarettes    Quit date: 11/18/2014  . Smokeless tobacco: Never Used  . Alcohol Use: No     Comment: 1 wine cooler every 3 months  . Drug Use: No  . Sexual Activity: Yes   Other Topics Concern  . Not on file   Social History Narrative   Married. Education: [employer]  Exercise: Walks daily for 1 hour.    Prior to Admission medications   Medication Sig Start Date End Date Taking? Authorizing Provider  calcium carbonate (OS-CAL - DOSED IN MG OF ELEMENTAL CALCIUM) 1250 MG tablet Take 1 tablet by mouth daily.   Yes Historical Provider, MD  cholecalciferol (VITAMIN D) 1000 UNITS tablet Take 1,000 Units by mouth daily.   Yes Historical Provider, MD  Multiple Vitamin (MULTIVITAMIN) tablet Take 1 tablet by mouth daily.   Yes Historical Provider, MD  OVER THE COUNTER MEDICATION OTC Krill Oil taking daily   Yes  Historical Provider, MD  rosuvastatin (CRESTOR) 5 MG tablet Take 1 tablet every night at bedtime or as directed. 12/18/14  Yes Porfirio Oar, PA-C   Allergies  Allergen Reactions  . Tetracyclines & Related Other (See Comments)    Pt reports she gets "shakey"      Review of Systems  Constitutional: Negative.   HENT: Negative.   Eyes: Negative.   Respiratory: Negative.   Cardiovascular: Negative.   Gastrointestinal: Negative.   Endocrine: Negative.   Genitourinary: Negative.   Musculoskeletal: Negative.   Skin: Negative.   Allergic/Immunologic: Negative.   Neurological: Negative.   Hematological: Negative.   Psychiatric/Behavioral: Negative.        Objective:   Physical Exam  Constitutional: She is oriented to person, place, and time. Vital signs are normal. She appears well-developed  and well-nourished. She is active and cooperative. No distress.  BP 140/76 mmHg  Pulse 79  Temp(Src) 98.4 F (36.9 C) (Oral)  Resp 16  Ht  (1.575 m)  Wt 160 lb 9.6 oz (72.848 kg)  BMI 29.37 kg/m2  SpO2 97%   HENT:  Head: Normocephalic and atraumatic.  Right Ear: Hearing, tympanic membrane, external ear and ear canal normal. No foreign bodies.  Left Ear: Hearing, tympanic membrane, external ear and ear canal normal. No foreign bodies.  Nose: Nose normal.  Mouth/Throat: Uvula is midline, oropharynx is clear and moist and mucous membranes are normal. No oral lesions. Normal dentition. No dental abscesses or uvula swelling. No oropharyngeal exudate.  Eyes: Conjunctivae, EOM and lids are normal. Pupils are equal, round, and reactive to light. Right eye exhibits no discharge. Left eye exhibits no discharge. No scleral icterus.  Fundoscopic exam:      The right eye shows no arteriolar narrowing, no AV nicking, no exudate, no hemorrhage and no papilledema. The right eye shows red reflex.       The left eye shows no arteriolar narrowing, no AV nicking, no exudate, no hemorrhage and no papilledema. The left eye shows red reflex.  Neck: Trachea normal, normal range of motion and full passive range of motion without pain. Neck supple. No spinous process tenderness and no muscular tenderness present. No thyroid mass and no thyromegaly present.  Cardiovascular: Normal rate, regular rhythm, normal heart sounds, intact distal pulses and normal pulses.   Pulmonary/Chest: Effort normal and breath sounds normal. Right breast exhibits no inverted nipple, no mass, no nipple discharge, no skin change and no tenderness. Left breast exhibits no inverted nipple, no mass, no nipple discharge, no skin change and no tenderness. Breasts are symmetrical.  Musculoskeletal: She exhibits no edema or tenderness.       Cervical back: Normal.       Thoracic back: Normal.       Lumbar back: Normal.  Lymphadenopathy:        Head (right side): No tonsillar, no preauricular, no posterior auricular and no occipital adenopathy present.       Head (left side): No tonsillar, no preauricular, no posterior auricular and no occipital adenopathy present.    She has no cervical adenopathy.       Right: No supraclavicular adenopathy present.       Left: No supraclavicular adenopathy present.  Neurological: She is alert and oriented to person, place, and time. She has normal strength and normal reflexes. No cranial nerve deficit. She exhibits normal muscle tone. Coordination and gait normal.  Skin: Skin is warm, dry and intact. No rash noted. She is not diaphoretic. No  cyanosis or erythema. Nails show no clubbing.  Psychiatric: She has a normal mood and affect. Her speech is normal and behavior is normal. Judgment and thought content normal.          Assessment & Plan:  1. Annual physical exam Age appropriate anticipatory guidance provided.  2. Essential hypertension Restart medication to manage blood pressure. - Comprehensive metabolic panel - TSH - lisinopril (PRINIVIL,ZESTRIL) 5 MG tablet; Take 1 tablet (5 mg total) by mouth daily.  Dispense: 90 tablet; Refill: 3  3. Hyperlipidemia Await lab results. Adjust treatment pending results. - Lipid panel - rosuvastatin (CRESTOR) 5 MG tablet; Take 1 tablet every night at bedtime or as directed.  Dispense: 30 tablet; Refill: 5  4. Need for influenza vaccination - Flu Vaccine QUAD 36+ mos IM  5. Colon cancer screening - Ambulatory referral to Gastroenterology  6. Screening for HIV (human immunodeficiency virus) - HIV antibody  7. Need for hepatitis C screening test - Hepatitis C antibody  8. Screening for deficiency anemia - CBC  9. Estrogen deficiency - Vit D  25 hydroxy (rtn osteoporosis monitoring)  10. Need for shingles vaccine - zoster vaccine live, PF, (ZOSTAVAX) 40981 UNT/0.65ML injection; Inject 19,400 Units into the skin once.  Dispense: 1 each;  Refill: 0   Return in about 6 months (around 06/18/2015).   Fernande Bras, PA-C Physician Assistant-Certified [facility]

## 2014-12-18 NOTE — Progress Notes (Signed)
Subjective:     Patient ID: Marissa Howard, female   DOB: [birth year], 59 y.o.   MRN: [id number] PCP: JEFFERY,CHELLE, PA-C   Chief Complaint  Patient presents with  . Annual Exam  . Medication Refill    Crestor 5 mg    HPI Patient presents today for her annual exam. She is a previous patient of Dr. Audria Nine.  She had her mammogram done today (12/18/2014).   She needs a refill of her Crestor.   She has lab work that needs to be done for biometrics.  She is trying to lose weight. She plans to get 5 pound weights for home use for weight bearing exercise. She wishes to get involved with a gym to do water aerobics. It is hard for her to get to a [place] She hopes to gain friends from going to a gym that she can attend water aerobics classes with.   She is not taking a blood pressure medication. She went off of this medication in 2014 because "it counteracted the stress test" she had to undergo for heart palpitations. After this, she never went back on the medication.   She quit smoking 1 month ago. She reads, cross stiches to keep her mind off of wanting to smoke.    Review of Systems  Constitutional: Negative for fever, chills and unexpected weight change.  Eyes: Negative for visual disturbance.  Respiratory: Negative for shortness of breath.   Cardiovascular: Negative for chest pain.  Gastrointestinal: Negative for nausea, vomiting, diarrhea, constipation and blood in stool.  Endocrine: Negative for polydipsia, polyphagia and polyuria.  Genitourinary: Negative for dysuria, urgency and frequency.  Musculoskeletal: Positive for arthralgias (Right knee, "I figure that will go away when I lose the weight"). Negative for myalgias.  Skin: Negative for rash.  Allergic/Immunologic: Negative for environmental allergies and food allergies.  Neurological: Negative for dizziness, syncope, light-headedness and headaches.  Psychiatric/Behavioral:  Negative.   See HPI   Patient Active Problem List   Diagnosis Date Noted  . Palpitations 06/15/2011  . Chest pain 05/19/2011  . HTN (hypertension) 03/29/2011  . Nicotine addiction 03/29/2011  . Elevated LFTs 12/17/2010  . Hyperlipemia 12/17/2010    Social History   Social History  . Marital Status: Married    Spouse Name: N/A  . Number of Children: 3  . Years of Education: N/A   Occupational History  . Dispatcher    Social History Main Topics  . Smoking status: Former Smoker -- 0.30 packs/day for 30 years    Types: Cigarettes    Quit date: 11/18/2014  . Smokeless tobacco: Never Used  . Alcohol Use: No     Comment: 1 wine cooler every 3 months  . Drug Use: No  . Sexual Activity: Yes   Other Topics Concern  . Not on file   Social History Narrative   Married. Education: [employer]  Exercise: Walks daily for 1 hour.      Prior to Admission medications   Medication Sig Start Date End Date Taking? Authorizing Provider  calcium carbonate (OS-CAL - DOSED IN MG OF ELEMENTAL CALCIUM) 1250 MG tablet Take 1 tablet by mouth daily.   Yes Historical Provider, MD  cholecalciferol (VITAMIN D) 1000 UNITS tablet Take 1,000 Units by mouth daily.   Yes Historical Provider, MD  Multiple Vitamin (MULTIVITAMIN) tablet Take 1 tablet by mouth daily.   Yes Historical Provider, MD  OVER THE COUNTER MEDICATION OTC Krill Oil taking  daily   Yes Historical Provider, MD  rosuvastatin (CRESTOR) 5 MG tablet Take 1 tablet every night at bedtime or as directed. 10/12/13  Yes Maurice March, MD  zoster vaccine live, PF, (ZOSTAVAX) 16109 UNT/0.65ML injection Inject 19,400 Units into the skin once. Patient not taking: Reported on 12/18/2014 04/20/14   Maurice March, MD    Allergies  Allergen Reactions  . Tetracyclines & Related Other (See Comments)    Pt reports she gets "shakey"      Objective:  Physical Exam  Constitutional: She is oriented to person, place, and time. She appears  well-developed and well-nourished.  HENT:  Head: Normocephalic and atraumatic.  Eyes: Pupils are equal, round, and reactive to light.  Neck: Normal range of motion. Neck supple.  Cardiovascular: Normal rate and regular rhythm.   Pulmonary/Chest: Effort normal and breath sounds normal. Right breast exhibits no inverted nipple, no mass, no nipple discharge, no skin change and no tenderness. Left breast exhibits no inverted nipple, no mass, no nipple discharge, no skin change and no tenderness. Breasts are symmetrical.  Abdominal: Soft. Bowel sounds are normal.  Neurological: She is alert and oriented to person, place, and time.  Skin: Skin is warm and dry.  Psychiatric: She has a normal mood and affect. Her behavior is normal. Thought content normal.    BP 140/76 mmHg  Pulse 79  Temp(Src) 98.4 F (36.9 C) (Oral)  Resp 16  Ht  (1.575 m)  Wt 160 lb 9.6 oz (72.848 kg)  BMI 29.37 kg/m2  SpO2 97%   . Assessment & Plan:  1. Annual physical exam No abnormal findings. Age-appropriate anticipatory guidance given.  2. Essential hypertension Begin lisinopril. Await lab results.  - Comprehensive metabolic panel - TSH - lisinopril (PRINIVIL,ZESTRIL) 5 MG tablet; Take 1 tablet (5 mg total) by mouth daily.  Dispense: 90 tablet; Refill: 3  3. Hyperlipidemia Await lab results. Continue current treatment of Crestor.  - Lipid panel - rosuvastatin (CRESTOR) 5 MG tablet; Take 1 tablet every night at bedtime or as directed.  Dispense: 30 tablet; Refill: 5  4. Need for influenza vaccination - Flu Vaccine QUAD 36+ mos IM  5. Colon cancer screening - Ambulatory referral to Gastroenterology  6. Screening for HIV (human immunodeficiency virus) - HIV antibody  7. Need for hepatitis C screening test - Hepatitis C antibody  8. Screening for deficiency anemia - CBC  9. Estrogen deficiency - Vit D  25 hydroxy (rtn osteoporosis monitoring)  10. Need for shingles vaccine - zoster vaccine  live, PF, (ZOSTAVAX) 60454 UNT/0.65ML injection; Inject 19,400 Units into the skin once.  Dispense: 1 each; Refill: 0   Follow-up in 6 months (around 06/18/2015).  Onya Eutsler D. Race, PA-S Physician Assistant Student [facility]

## 2014-12-19 ENCOUNTER — Other Ambulatory Visit: Payer: Self-pay | Admitting: Physician Assistant

## 2014-12-19 DIAGNOSIS — R928 Other abnormal and inconclusive findings on diagnostic imaging of breast: Secondary | ICD-10-CM

## 2014-12-19 LAB — VITAMIN D 25 HYDROXY (VIT D DEFICIENCY, FRACTURES): Vit D, 25-Hydroxy: 46 ng/mL (ref 30–100)

## 2014-12-21 ENCOUNTER — Encounter: Payer: Self-pay | Admitting: Physician Assistant

## 2014-12-25 ENCOUNTER — Ambulatory Visit
Admission: RE | Admit: 2014-12-25 | Discharge: 2014-12-25 | Disposition: A | Discharge status: Home / Self Care | Payer: Managed Care, Other (non HMO) | Source: Ambulatory Visit | Attending: Physician Assistant | Admitting: Physician Assistant

## 2014-12-25 ENCOUNTER — Encounter: Payer: Self-pay | Admitting: Physician Assistant

## 2014-12-25 DIAGNOSIS — R928 Other abnormal and inconclusive findings on diagnostic imaging of breast: Secondary | ICD-10-CM

## 2015-05-24 ENCOUNTER — Ambulatory Visit (INDEPENDENT_AMBULATORY_CARE_PROVIDER_SITE_OTHER): Payer: Managed Care, Other (non HMO)

## 2015-05-24 ENCOUNTER — Ambulatory Visit (INDEPENDENT_AMBULATORY_CARE_PROVIDER_SITE_OTHER): Payer: Managed Care, Other (non HMO) | Admitting: Urgent Care

## 2015-05-24 VITALS — BP 140/80 | HR 82 | Temp 98.5°F | Resp 16 | Ht 63.0 in | Wt 158.0 lb

## 2015-05-24 DIAGNOSIS — S46911A Strain of unspecified muscle, fascia and tendon at shoulder and upper arm level, right arm, initial encounter: Secondary | ICD-10-CM | POA: Diagnosis not present

## 2015-05-24 DIAGNOSIS — M62838 Other muscle spasm: Secondary | ICD-10-CM

## 2015-05-24 DIAGNOSIS — M25511 Pain in right shoulder: Secondary | ICD-10-CM

## 2015-05-24 DIAGNOSIS — M6248 Contracture of muscle, other site: Secondary | ICD-10-CM

## 2015-05-24 MED ORDER — CYCLOBENZAPRINE HCL 5 MG PO TABS: 5.0000 mg | ORAL_TABLET | Freq: Three times a day (TID) | ORAL | Status: DC | PRN

## 2015-05-24 MED ORDER — MELOXICAM 7.5 MG PO TABS: 7.5000 mg | ORAL_TABLET | Freq: Every day | ORAL | Status: DC

## 2015-05-24 NOTE — Progress Notes (Signed)
    MRN: [id number] DOB: 07/22/1955  Subjective:   Marissa Falconeraula R Servellon is a 60 y.o. female presenting for chief complaint of Shoulder Pain  Reports 2 week history of suffering a fall. She broke her fall by extending her right arm. She has since felt worsening right shoulder pain, worst in the past 2 days. She has difficulty moving her arm above her chest area. Has been using Advil. Of note, patient has been seeing a chiropractor which has helped with her neck and back pain but they have not particularly worked on her shoulder. She denies bony deformity, popping or tearing noises, erythema, swelling, numbness or tingling.  Marissa Howard has a current medication list which includes the following prescription(s): calcium carbonate, cholecalciferol, lisinopril, multivitamin, OVER THE COUNTER MEDICATION, rosuvastatin, and zoster vaccine live (pf). Also is allergic to tetracyclines & related.  Marissa Howard  has a past medical history of Palpitations (12/17/2010); Hyperlipidemia; Hypertension; and Tobacco abuse. Also  has past surgical history that includes Abdominal hysterectomy and Breast lumpectomy.  Objective:   Vitals: BP 140/80 mmHg  Pulse 82  Temp(Src) 98.5 F (36.9 C) (Oral)  Resp 16  Ht 5\' 3"  (1.6 m)  Wt 158 lb (71.668 kg)  BMI 28.00 kg/m2  SpO2 98%  Physical Exam  Constitutional: She appears well-developed and well-nourished.  Cardiovascular: Normal rate.   Pulmonary/Chest: Effort normal.  Musculoskeletal:       Right shoulder: She exhibits decreased range of motion (external rotation, abduction, adduction), tenderness (positive Hawkins, Neer test), bony tenderness (over AC joint) and spasm (over right trapezius). She exhibits no swelling, no effusion, no crepitus, no deformity, no laceration and normal strength.       Arms: Skin: Skin is warm and dry.   Dg Shoulder Right  05/24/2015  CLINICAL DATA:  Shoulder pain, status post fall. EXAM: RIGHT SHOULDER - 2+ VIEW COMPARISON:  CT scan of the chest  dated October 01, 2005 which included the right shoulder. FINDINGS: The bones are reasonably well mineralized. The glenohumeral joint and AC joint are normal. The subacromial-subdeltoid space is normal. No acute fracture nor dislocation is observed. The observed portions of the right clavicle and upper right ribs are normal. IMPRESSION: There is no acute bony abnormality of the right shoulder. If rotator cuff pathology is suspected clinically, MRI would be the most useful next imaging step. Electronically Signed   By: David  SwazilandJordan M.D.   On: 05/24/2015 12:48   Assessment and Plan :   1. Right shoulder pain 2. Right shoulder strain, initial encounter 3. Trapezius muscle spasm - Physical exam findings are consistent with rotator cuff pathology. Patient needs an MRI and given how much difficulty she is having with her shoulder, I will refer her urgently to ortho for imaging, further work up and imaging.  Marissa BambergMario Maury Bamba, PA-C [facility] [phone] 05/24/2015 12:24 PM

## 2015-05-24 NOTE — Patient Instructions (Addendum)
IF you received an x-ray today, you will receive an invoice from [facility]. Please contact [facility] at [phone] with questions or concerns regarding your invoice.   IF you received labwork today, you will receive an invoice from [employer] [facility]. Please contact [facility] at [phone] with questions or concerns regarding your invoice.   Our billing staff will not be able to assist you with questions regarding bills from these companies.  You will be contacted with the lab results as soon as they are available. The fastest way to get your results is to activate your My Chart account. Instructions are located on the last page of this paperwork. If you have not heard from [place] regarding the results in 2 weeks, please contact this office.    Shoulder Pain The shoulder is the joint that connects your arms to your body. The bones that form the shoulder joint include the upper arm bone (humerus), the shoulder blade (scapula), and the collarbone (clavicle). The top of the humerus is shaped like a ball and fits into a rather flat socket on the scapula (glenoid cavity). A combination of muscles and strong, fibrous tissues that connect muscles to bones (tendons) support your shoulder joint and hold the ball in the socket. Small, fluid-filled sacs (bursae) are located in different areas of the joint. They act as cushions between the bones and the overlying soft tissues and help reduce friction between the gliding tendons and the bone as you move your arm. Your shoulder joint allows a wide range of motion in your arm. This range of motion allows you to do things like scratch your back or throw a ball. However, this range of motion also makes your shoulder more prone to pain from overuse and injury. Causes of shoulder pain can originate from both injury and overuse and usually can be grouped in the following four categories:  Redness, swelling, and pain (inflammation)  of the tendon (tendinitis) or the bursae (bursitis).  Instability, such as a dislocation of the joint.  Inflammation of the joint (arthritis).  Broken bone (fracture). HOME CARE INSTRUCTIONS   Apply ice to the sore area.  Put ice in a plastic bag.  Place a towel between your skin and the bag.  Leave the ice on for 15-20 minutes, 3-4 times per day for the first 2 days, or as directed by your health care provider.  Stop using cold packs if they do not help with the pain.  If you have a shoulder sling or immobilizer, wear it as long as your caregiver instructs. Only remove it to shower or bathe. Move your arm as little as possible, but keep your hand moving to prevent swelling.  Squeeze a soft ball or foam pad as much as possible to help prevent swelling.  Only take over-the-counter or prescription medicines for pain, discomfort, or fever as directed by your caregiver. SEEK MEDICAL CARE IF:   Your shoulder pain increases, or new pain develops in your arm, hand, or fingers.  Your hand or fingers become cold and numb.  Your pain is not relieved with medicines. SEEK IMMEDIATE MEDICAL CARE IF:   Your arm, hand, or fingers are numb or tingling.  Your arm, hand, or fingers are significantly swollen or turn white or blue. MAKE SURE YOU:   Understand these instructions.  Will watch your condition.  Will get help right away if you are not doing well or get worse.   This information is not intended to replace advice  given to you by your health care provider. Make sure you discuss any questions you have with your health care provider.   Document Released: 12/10/2004 Document Revised: 03/23/2014 Document Reviewed: 06/25/2014 Elsevier Interactive Patient Education [employer]2016 Elsevier Inc.

## 2015-06-18 ENCOUNTER — Ambulatory Visit (INDEPENDENT_AMBULATORY_CARE_PROVIDER_SITE_OTHER): Payer: Managed Care, Other (non HMO) | Admitting: Physician Assistant

## 2015-06-18 ENCOUNTER — Encounter: Payer: Self-pay | Admitting: Physician Assistant

## 2015-06-18 VITALS — BP 120/70 | HR 65 | Temp 98.1°F | Resp 16 | Ht 62.25 in | Wt 154.8 lb

## 2015-06-18 DIAGNOSIS — R05 Cough: Secondary | ICD-10-CM

## 2015-06-18 DIAGNOSIS — E785 Hyperlipidemia, unspecified: Secondary | ICD-10-CM | POA: Diagnosis not present

## 2015-06-18 DIAGNOSIS — R059 Cough, unspecified: Secondary | ICD-10-CM

## 2015-06-18 DIAGNOSIS — Z1211 Encounter for screening for malignant neoplasm of colon: Secondary | ICD-10-CM | POA: Diagnosis not present

## 2015-06-18 DIAGNOSIS — I10 Essential (primary) hypertension: Secondary | ICD-10-CM

## 2015-06-18 LAB — COMPREHENSIVE METABOLIC PANEL
ALBUMIN: 4.5 g/dL (ref 3.6–5.1)
ALT: 19 U/L (ref 6–29)
AST: 18 U/L (ref 10–35)
Alkaline Phosphatase: 82 U/L (ref 33–130)
BUN: 18 mg/dL (ref 7–25)
CALCIUM: 9.9 mg/dL (ref 8.6–10.4)
CHLORIDE: 105 mmol/L (ref 98–110)
CO2: 24 mmol/L (ref 20–31)
Creat: 0.88 mg/dL (ref 0.50–0.99)
GLUCOSE: 100 mg/dL — AB (ref 65–99)
Potassium: 4.4 mmol/L (ref 3.5–5.3)
Sodium: 140 mmol/L (ref 135–146)
Total Bilirubin: 0.6 mg/dL (ref 0.2–1.2)
Total Protein: 7.3 g/dL (ref 6.1–8.1)

## 2015-06-18 LAB — LIPID PANEL
CHOL/HDL RATIO: 4.1 ratio (ref <=5.0)
Cholesterol: 220 mg/dL — ABNORMAL HIGH (ref 125–200)
HDL: 54 mg/dL (ref >=46)
LDL CALC: 147 mg/dL — AB (ref <130)
TRIGLYCERIDES: 93 mg/dL (ref <150)
VLDL: 19 mg/dL (ref <30)

## 2015-06-18 NOTE — Patient Instructions (Addendum)
STOP the lisinopril, as it may be the cause of your cough. If it goes away, let me know. If your blood pressure is running >140/90 off the medication, we'll need to start something else. If it DOESN'T go away, let me know that, too, but go ahead and restart the lisinopril.  Did you know that you begin to benefit from quitting smoking within the first twenty minutes? It's TRUE.  At 20 minutes: -blood pressure decreases -pulse rate drops -body temperature of hands and feet increases  At 8 hours: -carbon monoxide level in blood drops to normal -oxygen level in blood increases to normal  At 24 hours: -the chance of heart attack decreases  At 48 hours: -nerve endings start regrowing -ability to smell and taste is enhanced  2 weeks-3 months: -circulation improves -walking becomes easier -lung function improves  1-9 months: -coughing, sinus congestion, fatigue and shortness of breath decreases  1 year: -excess risk of heart disease is decreased to HALF that of a smoker  5 years: Stroke risk is reduced to that of people who have never smoked  10 years: -risk of lung cancer drops to as little as half that of continuing smokers -risk of cancer of the mouth, throat, esophagus, bladder, kidney and pancreas decreases -risk of ulcer decreases  15 years -risk of heart disease is now similar to that of people who have never smoked -risk of death returns to nearly the level of people who have never smoked       IF you received an x-ray today, you will receive an invoice from [facility]. Please contact [facility] at [phone] with questions or concerns regarding your invoice.   IF you received labwork today, you will receive an invoice from [facility]. Please contact [facility] at [phone] with questions or concerns regarding your invoice.   Our billing staff will not be able to assist you with questions regarding bills from  these companies.  You will be contacted with the lab results as soon as they are available. The fastest way to get your results is to activate your My Chart account. Instructions are located on the last page of this paperwork. If you have not heard from us regarding the results in 2 weeks, please contact this office.

## 2015-06-18 NOTE — Progress Notes (Signed)
Patient ID: Marissa Howard, female    DOB: May 29, 1955, 60 y.o.   MRN: [id number]  PCP: Kazimir Hartnett, PA-C  Subjective:   Chief Complaint  Patient presents with  . Follow-up    6 mos  . Hyperlipidemia  . Hypertension  . Medication Refill    HPI Presents for evaluation of HTN and hyperlipidemia.  During her last visit in October 2016 she was started on Lisinopril for elevated blood pressure. Patient complains of productive cough that has been present since her URI in January. She states it is worse at night and is "aggrevating". Non-productive.  She continues trying to diet and exercise but is hard to find time in her schedule. She recently started going with her grandchildren to the ballpark and walking.  Patient recently started back smoking 3-4 cigarettes a day due stress caused by family issues. She understands that she needs to find other hobbies to help take her mind off smoking.   She still has not followed up with GI for colonoscopy screening. She is asking for another referral.  She fell in March going down the stairs and injured her right shoulder. She is currently taking PT and states is seems to be getting better.   Needs lab work to monitor kidney function after starting ACE inhibitor and lipid panel.     Review of Systems Constitutional: Negative for fever, fatigue and unexpected weight change.  HENT: Negative for sinus pressure.  Respiratory: Positive for cough. Negative for shortness of breath and wheezing.  Cardiovascular: Negative for chest pain and leg swelling.  Gastrointestinal: Negative for nausea, vomiting, abdominal pain and diarrhea.  Genitourinary: Negative for dysuria, urgency and frequency.  Musculoskeletal: Positive for arthralgias. Negative for myalgias.     Patient Active Problem List   Diagnosis Date Noted  . Palpitations 06/15/2011  . Chest pain 05/19/2011  . HTN (hypertension) 03/29/2011  . Former smoker 03/29/2011  .  Elevated LFTs 12/17/2010  . Hyperlipemia 12/17/2010     Prior to Admission medications   Medication Sig Start Date End Date Taking? Authorizing Provider  calcium carbonate (OS-CAL - DOSED IN MG OF ELEMENTAL CALCIUM) 1250 MG tablet Take 1 tablet by mouth daily.   Yes Historical Provider, MD  cholecalciferol (VITAMIN D) 1000 UNITS tablet Take 1,000 Units by mouth daily.   Yes Historical Provider, MD  cyclobenzaprine (FLEXERIL) 5 MG tablet Take 1-2 tablets (5-10 mg total) by mouth 3 (three) times daily as needed for muscle spasms. 05/24/15  Yes Wallis Bamberg, PA-C  lisinopril (PRINIVIL,ZESTRIL) 5 MG tablet Take 1 tablet (5 mg total) by mouth daily. 12/18/14  Yes Charlsey Moragne, PA-C  Multiple Vitamin (MULTIVITAMIN) tablet Take 1 tablet by mouth daily.   Yes Historical Provider, MD  OVER THE COUNTER MEDICATION OTC Krill Oil taking daily   Yes Historical Provider, MD  rosuvastatin (CRESTOR) 5 MG tablet Take 1 tablet every night at bedtime or as directed. 12/18/14  Yes Ezma Rehm, PA-C  meloxicam (MOBIC) 7.5 MG tablet Take 1-2 tablets (7.5-15 mg total) by mouth daily. Patient not taking: Reported on 06/18/2015 05/24/15   Wallis Bamberg, PA-C     Allergies  Allergen Reactions  . Tetracyclines & Related Other (See Comments)    Pt reports she gets "shakey"       Objective:  Physical Exam  Constitutional: She is oriented to person, place, and time. She appears well-developed and well-nourished. She is active and cooperative. No distress.  BP 120/70 mmHg  Pulse 65  Temp(Src) 98.1 F (  36.7 C) (Oral)  Resp 16  Ht 5' 2.25" (1.581 m)  Wt 154 lb 12.8 oz (70.217 kg)  BMI 28.09 kg/m2  SpO2 98%  HENT:  Head: Normocephalic and atraumatic.  Right Ear: Hearing normal.  Left Ear: Hearing normal.  Eyes: Conjunctivae are normal. No scleral icterus.  Neck: Normal range of motion. Neck supple. No thyromegaly present.  Cardiovascular: Normal rate, regular rhythm and normal heart sounds.   Pulses:       Radial pulses are 2+ on the right side, and 2+ on the left side.  Pulmonary/Chest: Effort normal and breath sounds normal.  Lymphadenopathy:       Head (right side): No tonsillar, no preauricular, no posterior auricular and no occipital adenopathy present.       Head (left side): No tonsillar, no preauricular, no posterior auricular and no occipital adenopathy present.    She has no cervical adenopathy.       Right: No supraclavicular adenopathy present.       Left: No supraclavicular adenopathy present.  Neurological: She is alert and oriented to person, place, and time. No sensory deficit.  Skin: Skin is warm, dry and intact. No rash noted. No cyanosis or erythema. Nails show no clubbing.  Psychiatric: She has a normal mood and affect. Her speech is normal and behavior is normal.           Assessment & Plan:   1. Essential hypertension Controlled, though ACEI may be contributing to cough. See below. If needs a different antihypertensive, would try losartan 25 mg. - Comprehensive metabolic panel  2. Hyperlipidemia Await lab results. Increase rosuvastatin if indicated. - Lipid panel  3. Cough May be residual from URI she had in January, but may also be ACEI induced. GERD also a consideration, given that it is worse at night. Stop lisinopril x 2 weeks. If cough resolves, contact me with BP readings. Consider starting losartan. If cough persists, resume lisinopril and contact me-would prescribe cough Supressant for nighttime.   4. Screening for colon cancer Refer to GI again. Encouraged her to schedule when she is called this time. - Ambulatory referral to Gastroenterology    Return in about 6 months (around 12/18/2015) for re-evaluation of blood pressure and cholesteril.    Fernande Brashelle S. Jess Sulak, PA-C Physician Assistant-Certified [facility]

## 2015-06-18 NOTE — Progress Notes (Signed)
Subjective:    Patient ID: Marissa Howard, female    DOB: 1956-03-07, 60 y.o.   MRN: [id number]  Chief Complaint  Patient presents with  . Follow-up    6 mos  . Hyperlipidemia  . Hypertension  . Medication Refill    HPI   Patient presents today for her 6 mo follow up.  During her last visit in October 2016 she was started on Lisinopril for elevated blood pressure. Patient complains of productive cough that has been present since her URI in January. She states it is worse at night and is "aggrevating".  She continues trying to diet and exercise but is hard to find time in her schedule. She recently started going with her grandchildren to the ballpark and walking. Patient recently started back smoking 3-4 cigarettes a day due stress caused by family issues. She understands that she needs to find other hobbies to help take her mind off smoking.  She still has not followed up with GI for colonoscopy screening. She is asking for another referral. She recently fell in March going down the stairs and injured her right shoulder. She is following up with PT and states is seems to be getting better.  Needs lab work to monitor kidney function after starting ACE inhibitor and lipid panel.   Patient Active Problem List   Diagnosis Date Noted  . Palpitations 06/15/2011  . Chest pain 05/19/2011  . HTN (hypertension) 03/29/2011  . Former smoker 03/29/2011  . Elevated LFTs 12/17/2010  . Hyperlipemia 12/17/2010   Past Medical History  Diagnosis Date  . Palpitations 12/17/2010  . Hyperlipidemia   . Hypertension   . Tobacco abuse     Current Outpatient Prescriptions on File Prior to Visit  Medication Sig Dispense Refill  . calcium carbonate (OS-CAL - DOSED IN MG OF ELEMENTAL CALCIUM) 1250 MG tablet Take 1 tablet by mouth daily.    . cholecalciferol (VITAMIN D) 1000 UNITS tablet Take 1,000 Units by mouth daily.    . cyclobenzaprine (FLEXERIL) 5 MG tablet Take 1-2 tablets (5-10 mg total)  by mouth 3 (three) times daily as needed for muscle spasms. 30 tablet 1  . lisinopril (PRINIVIL,ZESTRIL) 5 MG tablet Take 1 tablet (5 mg total) by mouth daily. 90 tablet 3  . Multiple Vitamin (MULTIVITAMIN) tablet Take 1 tablet by mouth daily.    Marland Kitchen OVER THE COUNTER MEDICATION OTC Krill Oil taking daily    . rosuvastatin (CRESTOR) 5 MG tablet Take 1 tablet every night at bedtime or as directed. 30 tablet 5  . meloxicam (MOBIC) 7.5 MG tablet Take 1-2 tablets (7.5-15 mg total) by mouth daily. (Patient not taking: Reported on 06/18/2015) 30 tablet 1   No current facility-administered medications on file prior to visit.   Allergies  Allergen Reactions  . Tetracyclines & Related Other (See Comments)    Pt reports she gets "shakey"       Review of Systems  Constitutional: Negative for fever, fatigue and unexpected weight change.  HENT: Negative for sinus pressure.   Respiratory: Positive for cough. Negative for shortness of breath and wheezing.   Cardiovascular: Negative for chest pain and leg swelling.  Gastrointestinal: Negative for nausea, vomiting, abdominal pain and diarrhea.  Genitourinary: Negative for dysuria, urgency and frequency.  Musculoskeletal: Positive for arthralgias. Negative for myalgias.         Objective:   Physical Exam  Constitutional: She is oriented to person, place, and time. She appears well-developed and well-nourished. No distress.  HENT:  Head: Normocephalic and atraumatic.  Right Ear: External ear normal.  Left Ear: External ear normal.  Mouth/Throat: Oropharynx is clear and moist.  Eyes: Pupils are equal, round, and reactive to light.  Neck: Normal range of motion. Neck supple. No thyromegaly present.  Cardiovascular: Normal rate, regular rhythm and normal heart sounds.   Pulmonary/Chest: Effort normal and breath sounds normal.  Abdominal: Soft. Bowel sounds are normal.  Lymphadenopathy:    She has no cervical adenopathy.  Neurological: She is alert  and oriented to person, place, and time.  Skin: Skin is warm and dry.          Assessment & Plan:  1. Essential hypertension Blood pressure controlled on Lisinopril but cough may be due to ACEI. Patient to stop lisinopril x 2 weeks. If cough resolves patient needs to contact office to inform PCP and also discuss BP readings. - Will obtain CMP to check kidney function  2. Hyperlipidemia Lipid panel performed today and awaiting results to determine if we need to increase statin therapy. Patient denies any side effects including myalgia.  3. Cough Patient had a URI in January and the cough could be residual. Also could be due to the lisinopril. Will follow recommendation above to determine if cough is due to ACEI.  4. Referral to GI for colonoscopy Referral resent to GI  Azucena Kubayler Leaphart PA-s 06/18/2015

## 2015-07-03 ENCOUNTER — Other Ambulatory Visit: Payer: Self-pay | Admitting: Physician Assistant

## 2015-07-05 ENCOUNTER — Telehealth: Payer: Self-pay

## 2015-07-05 NOTE — Telephone Encounter (Signed)
Insurance is denying the medication rosuvastatin (CRESTOR) 5 MG tablet because it still has the 5mg  and they are saying it is too early to refill. Marissa Howard recently has increased this to 10mg . Pt wants to know how this will play out because she is out of this medication.  Please advise

## 2015-07-06 ENCOUNTER — Encounter: Payer: Self-pay | Admitting: Physician Assistant

## 2015-07-08 MED ORDER — ROSUVASTATIN CALCIUM 10 MG PO TABS: 10.0000 mg | ORAL_TABLET | Freq: Every day | ORAL | Status: DC

## 2015-07-08 NOTE — Telephone Encounter (Signed)
Left message for pt to call back  °

## 2015-07-08 NOTE — Telephone Encounter (Signed)
Meds ordered this encounter  Medications  . rosuvastatin (CRESTOR) 10 MG tablet    Sig: Take 1 tablet (10 mg total) by mouth daily at 6 PM.    Dispense:  90 tablet    Refill:  3    Order Specific Question:  Supervising Provider    Answer:  DOOLITTLE, ROBERT P [[id number]]

## 2015-07-08 NOTE — Telephone Encounter (Signed)
Can we just change to 10mg .

## 2015-07-11 ENCOUNTER — Telehealth: Payer: Self-pay | Admitting: Physician Assistant

## 2015-07-11 NOTE — Telephone Encounter (Signed)
I spoke with the pharmacy just now, having just received the patient's email about her Crestor. She picked up the new prescription on the day I sent it in, 07/08/2015.

## 2015-10-01 ENCOUNTER — Ambulatory Visit (INDEPENDENT_AMBULATORY_CARE_PROVIDER_SITE_OTHER): Payer: Managed Care, Other (non HMO) | Admitting: Family Medicine

## 2015-10-01 ENCOUNTER — Encounter: Payer: Self-pay | Admitting: Physician Assistant

## 2015-10-01 VITALS — BP 128/82 | HR 72 | Temp 98.2°F | Resp 17 | Ht 63.0 in | Wt 160.0 lb

## 2015-10-01 DIAGNOSIS — I10 Essential (primary) hypertension: Secondary | ICD-10-CM

## 2015-10-01 DIAGNOSIS — E785 Hyperlipidemia, unspecified: Secondary | ICD-10-CM | POA: Diagnosis not present

## 2015-10-01 DIAGNOSIS — R7989 Other specified abnormal findings of blood chemistry: Secondary | ICD-10-CM | POA: Diagnosis not present

## 2015-10-01 DIAGNOSIS — R945 Abnormal results of liver function studies: Secondary | ICD-10-CM

## 2015-10-01 NOTE — Patient Instructions (Addendum)
Incorporate changes in diet and increase activity to control Hypertension. Check blood pressure periodically and bring a log of readings to next visit. Schedule complete physical exam for October.   IF you received an x-ray today, you will receive an invoice from [facility]. Please contact [facility] at [phone] with questions or concerns regarding your invoice.   IF you received labwork today, you will receive an invoice from [facility]. Please contact [facility] at [phone] with questions or concerns regarding your invoice.   Our billing staff will not be able to assist you with questions regarding bills from these companies.  You will be contacted with the lab results as soon as they are available. The fastest way to get your results is to activate your My Chart account. Instructions are located on the last page of this paperwork. If you have not heard from us regarding the results in 2 weeks, please contact this office.

## 2015-10-01 NOTE — Progress Notes (Signed)
Patient ID: Marissa Howard, female    DOB: 05-30-1955, 60 y.o.   MRN: [id number]  PCP: Porfirio Oar, PA-C  Chief Complaint  Patient presents with  . Follow-up  . Labs Only    Pt is Fasting    Subjective:   HPI Presents for chronic disease management of hypertension and hyperlipidemia.   Hypertension  She stopped taking lisinopril 5 mg tablet approximately one month ago as she developed a cough. Cough since has resolved. Does not home monitor blood pressure. Uncertain if she's experienced hypertensive episodes since medication was stopped. Denies headaches, dizziness, chest pain, shortness of breath or fatigue.  Hyperlipidemia  Reports continued use of Crestor for hyperlipidemia management.  Denies leg or joint pain. Reports reducing intake of fried foods and incorporating more salad and vegetables in daily diet. She reports limited daily exercise as she is on feet a lot at work and occasionally experiences bilateral knee pain that is relieved with rest.   Review of Systems  Constitutional: Negative.   Respiratory: Negative.   Cardiovascular: Negative.   Allergic/Immunologic:       Reaction developed to Lisinopril.  Medication stopped over 1 month ago.  Neurological: Negative.        Patient Active Problem List   Diagnosis Date Noted  . Palpitations 06/15/2011  . Chest pain 05/19/2011  . HTN (hypertension) 03/29/2011  . Former smoker 03/29/2011  . Elevated LFTs 12/17/2010  . Hyperlipemia 12/17/2010     Prior to Admission medications   Medication Sig Start Date End Date Taking? Authorizing Provider  cholecalciferol (VITAMIN D) 1000 UNITS tablet Take 1,000 Units by mouth daily. Reported on 10/01/2015   Yes Historical Provider, MD  Multiple Vitamin (MULTIVITAMIN) tablet Take 1 tablet by mouth daily. Reported on 10/01/2015   Yes Historical Provider, MD  OVER THE COUNTER MEDICATION OTC Krill Oil taking daily   Yes Historical Provider, MD  rosuvastatin (CRESTOR)  10 MG tablet Take 1 tablet (10 mg total) by mouth daily at 6 PM. 07/08/15  Yes Chelle Jeffery, PA-C     Allergies  Allergen Reactions  . Lisinopril Cough  . Tetracyclines & Related Other (See Comments)    Pt reports she gets "shakey"       Objective:  Physical Exam  Constitutional: She is oriented to person, place, and time. She appears well-developed and well-nourished.  HENT:  Head: Normocephalic and atraumatic.  Right Ear: External ear normal.  Nose: Nose normal.  Mouth/Throat: Oropharynx is clear and moist.  Eyes: Conjunctivae are normal. Pupils are equal, round, and reactive to light.  Neck: Normal range of motion.  Cardiovascular: Normal rate, regular rhythm, normal heart sounds and intact distal pulses.   Pulmonary/Chest: Effort normal and breath sounds normal.  Musculoskeletal: Normal range of motion.  Neurological: She is alert and oriented to person, place, and time. She has normal reflexes.  Skin: Skin is warm and dry.  Psychiatric: She has a normal mood and affect. Her behavior is normal. Judgment and thought content normal.           Assessment & Plan:  .1. Essential hypertension - CBC with Differential/Platelet Controlled without medication at present.  Patient stopped taking blood pressure medication more than 1 month ago as she developed a cough related to Ace inhibitor Lisinopril.  Keep a log of blood pressure readings over the next three months.   Bring readings in the office to review during your annual physical exam in October.  At the at time, we will  evaluate the need for restarting antihypertensive medication. Increase activity. Decrease sodium and saturated fat intake.   2. Elevated LFTs - Comprehensive metabolic panel If elevated will re-evaluate treatment for hyperlipidemia.  3. Hyperlipemia - Lipid panel Stable. Continue Crestor 10 mg daily. Will follow-up with lab results.  Godfrey PickKimberly S. Tiburcio PeaHarris, MSN, FNP-C [facility]

## 2015-10-02 LAB — LIPID PANEL
CHOLESTEROL: 191 mg/dL (ref 125–200)
HDL: 39 mg/dL — AB (ref >=46)
LDL Cholesterol: 108 mg/dL (ref <130)
TRIGLYCERIDES: 219 mg/dL — AB (ref <150)
Total CHOL/HDL Ratio: 4.9 Ratio (ref <=5.0)
VLDL: 44 mg/dL — ABNORMAL HIGH (ref <30)

## 2015-10-02 LAB — CBC WITH DIFFERENTIAL/PLATELET
BASOS ABS: 0 {cells}/uL (ref 0–200)
Basophils Relative: 0 %
EOS ABS: 204 {cells}/uL (ref 15–500)
EOS PCT: 2 %
HEMATOCRIT: 45.1 % — AB (ref 35.0–45.0)
HEMOGLOBIN: 15.3 g/dL (ref 11.7–15.5)
LYMPHS ABS: 3060 {cells}/uL (ref 850–3900)
Lymphocytes Relative: 30 %
MCH: 31.6 pg (ref 27.0–33.0)
MCHC: 33.9 g/dL (ref 32.0–36.0)
MCV: 93.2 fL (ref 80.0–100.0)
MONO ABS: 714 {cells}/uL (ref 200–950)
MPV: 11.8 fL (ref 7.5–12.5)
Monocytes Relative: 7 %
NEUTROS ABS: 6222 {cells}/uL (ref 1500–7800)
NEUTROS PCT: 61 %
Platelets: 174 10*3/uL (ref 140–400)
RBC: 4.84 MIL/uL (ref 3.80–5.10)
RDW: 13.4 % (ref 11.0–15.0)
WBC: 10.2 10*3/uL (ref 3.8–10.8)

## 2015-10-02 LAB — COMPREHENSIVE METABOLIC PANEL
ALBUMIN: 4.6 g/dL (ref 3.6–5.1)
ALT: 21 U/L (ref 6–29)
AST: 21 U/L (ref 10–35)
Alkaline Phosphatase: 89 U/L (ref 33–130)
BILIRUBIN TOTAL: 0.6 mg/dL (ref 0.2–1.2)
BUN: 14 mg/dL (ref 7–25)
CALCIUM: 9.8 mg/dL (ref 8.6–10.4)
CHLORIDE: 109 mmol/L (ref 98–110)
CO2: 22 mmol/L (ref 20–31)
CREATININE: 0.96 mg/dL (ref 0.50–0.99)
Glucose, Bld: 101 mg/dL — ABNORMAL HIGH (ref 65–99)
Potassium: 4.3 mmol/L (ref 3.5–5.3)
SODIUM: 140 mmol/L (ref 135–146)
TOTAL PROTEIN: 7.1 g/dL (ref 6.1–8.1)

## 2015-10-04 ENCOUNTER — Encounter: Payer: Self-pay | Admitting: Family Medicine

## 2015-10-15 ENCOUNTER — Ambulatory Visit: Payer: Managed Care, Other (non HMO) | Admitting: Physician Assistant

## 2015-12-25 ENCOUNTER — Other Ambulatory Visit: Payer: Self-pay | Admitting: Physician Assistant

## 2015-12-25 DIAGNOSIS — Z1231 Encounter for screening mammogram for malignant neoplasm of breast: Secondary | ICD-10-CM

## 2016-01-09 ENCOUNTER — Ambulatory Visit
Admission: RE | Admit: 2016-01-09 | Discharge: 2016-01-09 | Disposition: A | Discharge status: Home / Self Care | Payer: Managed Care, Other (non HMO) | Source: Ambulatory Visit | Attending: Physician Assistant | Admitting: Physician Assistant

## 2016-01-09 DIAGNOSIS — Z1231 Encounter for screening mammogram for malignant neoplasm of breast: Secondary | ICD-10-CM

## 2016-07-28 ENCOUNTER — Other Ambulatory Visit: Payer: Self-pay | Admitting: Physician Assistant

## 2016-07-29 NOTE — Telephone Encounter (Signed)
Please call patient. Rx authorized. Needs to schedule follow-up before she runs out.  Meds ordered this encounter  Medications  . rosuvastatin (CRESTOR) 10 MG tablet    Sig: TAKE ONE TABLET BY MOUTH ONCE DAILY AT  6PM    Dispense:  90 tablet    Refill:  0

## 2016-07-31 ENCOUNTER — Other Ambulatory Visit: Payer: Self-pay | Admitting: Physician Assistant

## 2016-08-14 ENCOUNTER — Encounter: Payer: Self-pay | Admitting: Physician Assistant

## 2016-08-14 ENCOUNTER — Ambulatory Visit (INDEPENDENT_AMBULATORY_CARE_PROVIDER_SITE_OTHER): Payer: Managed Care, Other (non HMO) | Admitting: Physician Assistant

## 2016-08-14 VITALS — BP 160/84 | HR 74 | Temp 98.0°F | Resp 18 | Ht 63.0 in | Wt 155.8 lb

## 2016-08-14 DIAGNOSIS — Z6831 Body mass index (BMI) 31.0-31.9, adult: Secondary | ICD-10-CM | POA: Insufficient documentation

## 2016-08-14 DIAGNOSIS — Z23 Encounter for immunization: Secondary | ICD-10-CM

## 2016-08-14 DIAGNOSIS — Z0000 Encounter for general adult medical examination without abnormal findings: Secondary | ICD-10-CM | POA: Diagnosis not present

## 2016-08-14 DIAGNOSIS — I10 Essential (primary) hypertension: Secondary | ICD-10-CM

## 2016-08-14 DIAGNOSIS — Z6827 Body mass index (BMI) 27.0-27.9, adult: Secondary | ICD-10-CM

## 2016-08-14 DIAGNOSIS — R7989 Other specified abnormal findings of blood chemistry: Secondary | ICD-10-CM

## 2016-08-14 DIAGNOSIS — E785 Hyperlipidemia, unspecified: Secondary | ICD-10-CM

## 2016-08-14 DIAGNOSIS — R079 Chest pain, unspecified: Secondary | ICD-10-CM | POA: Diagnosis not present

## 2016-08-14 DIAGNOSIS — Z1211 Encounter for screening for malignant neoplasm of colon: Secondary | ICD-10-CM

## 2016-08-14 DIAGNOSIS — R945 Abnormal results of liver function studies: Secondary | ICD-10-CM

## 2016-08-14 MED ORDER — AMLODIPINE BESYLATE 5 MG PO TABS: 5.0000 mg | ORAL_TABLET | Freq: Every day | ORAL | 3 refills | Status: DC

## 2016-08-14 MED ORDER — ZOSTER VAC RECOMB ADJUVANTED 50 MCG/0.5ML IM SUSR: 0.5000 mL | Freq: Once | INTRAMUSCULAR | 1 refills | Status: AC

## 2016-08-14 NOTE — Assessment & Plan Note (Signed)
Uncontrolled. EKG is normal. Start amlodipine 5 mg. Recheck in 4 weeks.

## 2016-08-14 NOTE — Assessment & Plan Note (Signed)
Update labs.  

## 2016-08-14 NOTE — Assessment & Plan Note (Signed)
Normal/unchanged EKG is reassuring. Treat HTN and lipids. Re-evaluate with cardiology.
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
## 2016-08-14 NOTE — Assessment & Plan Note (Signed)
Healthy eating, regular exercise as able, given back and hip pain.

## 2016-08-14 NOTE — Patient Instructions (Addendum)
   IF you received an x-ray today, you will receive an invoice from [facility]. Please contact [facility] at [phone] with questions or concerns regarding your invoice.   IF you received labwork today, you will receive an invoice from [facility]. Please contact [facility] at [phone] with questions or concerns regarding your invoice.   Our billing staff will not be able to assist you with questions regarding bills from these companies.  You will be contacted with the lab results as soon as they are available. The fastest way to get your results is to activate your My Chart account. Instructions are located on the last page of this paperwork. If you have not heard from us regarding the results in 2 weeks, please contact this office.     Keeping You Healthy  Get These Tests  Blood Pressure- Have your blood pressure checked by your healthcare provider at least once a year.  Normal blood pressure is 120/80.  Weight- Have your body mass index (BMI) calculated to screen for obesity.  BMI is a measure of body fat based on height and weight.  You can calculate your own BMI at www.nhlbisupport.com/bmi/  Cholesterol- Have your cholesterol checked every year.  Diabetes- Have your blood sugar checked every year if you have high blood pressure, high cholesterol, a family history of diabetes or if you are overweight.  Pap Test - Have a pap test every 1 to 5 years if you have been sexually active.  If you are older than 65 and recent pap tests have been normal you may not need additional pap tests.  In addition, if you have had a hysterectomy  for benign disease additional pap tests are not necessary.  Mammogram-Yearly mammograms are essential for early detection of breast cancer  Screening for Colon Cancer- Colonoscopy starting at age 50. Screening may begin sooner depending on your family history and other health conditions.  Follow up colonoscopy as directed by your  Gastroenterologist.  Screening for Osteoporosis- Screening begins at age 65 with bone density scanning, sooner if you are at higher risk for developing Osteoporosis.  Get these medicines  Calcium with Vitamin D- Your body requires 1200-1500 mg of Calcium a day and 800-1000 IU of Vitamin D a day.  You can only absorb 500 mg of Calcium at a time therefore Calcium must be taken in 2 or 3 separate doses throughout the day.  Hormones- Hormone therapy has been associated with increased risk for certain cancers and heart disease.  Talk to your healthcare provider about if you need relief from menopausal symptoms.  Aspirin- Ask your healthcare provider about taking Aspirin to prevent Heart Disease and Stroke.  Get these Immuniztions  Flu shot- Every fall  Pneumonia shot- Once after the age of 65; if you are younger ask your healthcare provider if you need a pneumonia shot.  Tetanus- Every ten years.  Zostavax- Once after the age of 60 to prevent shingles.  Take these steps  Don't smoke- Your healthcare provider can help you quit. For tips on how to quit, ask your healthcare provider or go to www.smokefree.gov or call 1-800 QUIT-NOW.  Be physically active- Exercise 5 days a week for a minimum of 30 minutes.  If you are not already physically active, start slow and gradually work up to 30 minutes of moderate physical activity.  Try walking, dancing, bike riding, swimming, etc.  Eat a healthy diet- Eat a variety of healthy foods such as fruits, vegetables, whole grains, low   fat milk, low fat cheeses, yogurt, lean meats, chicken, fish, eggs, dried beans, tofu, etc.  For more information go to www.thenutritionsource.org  Dental visit- Brush and floss teeth twice daily; visit your dentist twice a year.  Eye exam- Visit your Optometrist or Ophthalmologist yearly.  Drink alcohol in moderation- Limit alcohol intake to one drink or less a day.  Never drink and drive.  Depression- Your emotional  health is as important as your physical health.  If you're feeling down or losing interest in things you normally enjoy, please talk to your healthcare provider.  Seat Belts- can save your life; always wear one  Smoke/Carbon Monoxide detectors- These detectors need to be installed on the appropriate level of your home.  Replace batteries at least once a year.  Violence- If anyone is threatening or hurting you, please tell your healthcare provider.  Living Will/ Health care power of attorney- Discuss with your healthcare provider and family.  
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
## 2016-08-14 NOTE — Assessment & Plan Note (Signed)
Update labs. Increase rosuvastatin if needed.

## 2016-08-15 LAB — COMPREHENSIVE METABOLIC PANEL
A/G RATIO: 1.9 (ref 1.2–2.2)
ALBUMIN: 5 g/dL — AB (ref 3.6–4.8)
ALT: 29 IU/L (ref 0–32)
AST: 28 IU/L (ref 0–40)
Alkaline Phosphatase: 113 IU/L (ref 39–117)
BILIRUBIN TOTAL: 0.5 mg/dL (ref 0.0–1.2)
BUN / CREAT RATIO: 12 (ref 12–28)
BUN: 11 mg/dL (ref 8–27)
CHLORIDE: 104 mmol/L (ref 96–106)
CO2: 23 mmol/L (ref 18–29)
Calcium: 10.2 mg/dL (ref 8.7–10.3)
Creatinine, Ser: 0.9 mg/dL (ref 0.57–1.00)
GFR, EST AFRICAN AMERICAN: 79 mL/min/{1.73_m2} (ref >59)
GFR, EST NON AFRICAN AMERICAN: 69 mL/min/{1.73_m2} (ref >59)
Globulin, Total: 2.6 g/dL (ref 1.5–4.5)
Glucose: 96 mg/dL (ref 65–99)
POTASSIUM: 4.6 mmol/L (ref 3.5–5.2)
Sodium: 145 mmol/L — ABNORMAL HIGH (ref 134–144)
TOTAL PROTEIN: 7.6 g/dL (ref 6.0–8.5)

## 2016-08-15 LAB — CBC WITH DIFFERENTIAL/PLATELET
BASOS: 0 %
Basophils Absolute: 0 10*3/uL (ref 0.0–0.2)
EOS (ABSOLUTE): 0.2 10*3/uL (ref 0.0–0.4)
EOS: 2 %
HEMATOCRIT: 49.4 % — AB (ref 34.0–46.6)
HEMOGLOBIN: 16.4 g/dL — AB (ref 11.1–15.9)
IMMATURE GRANS (ABS): 0 10*3/uL (ref 0.0–0.1)
Immature Granulocytes: 0 %
Lymphocytes Absolute: 3.6 10*3/uL — ABNORMAL HIGH (ref 0.7–3.1)
Lymphs: 30 %
MCH: 31.8 pg (ref 26.6–33.0)
MCHC: 33.2 g/dL (ref 31.5–35.7)
MCV: 96 fL (ref 79–97)
MONOCYTES: 7 %
Monocytes Absolute: 0.9 10*3/uL (ref 0.1–0.9)
NEUTROS ABS: 7.5 10*3/uL — AB (ref 1.4–7.0)
Neutrophils: 61 %
Platelets: 163 10*3/uL (ref 150–379)
RBC: 5.16 x10E6/uL (ref 3.77–5.28)
RDW: 13.7 % (ref 12.3–15.4)
WBC: 12.2 10*3/uL — ABNORMAL HIGH (ref 3.4–10.8)

## 2016-08-15 LAB — LIPID PANEL
CHOL/HDL RATIO: 5.3 ratio — AB (ref 0.0–4.4)
Cholesterol, Total: 232 mg/dL — ABNORMAL HIGH (ref 100–199)
HDL: 44 mg/dL (ref >39)
LDL Calculated: 128 mg/dL — ABNORMAL HIGH (ref 0–99)
Triglycerides: 298 mg/dL — ABNORMAL HIGH (ref 0–149)
VLDL CHOLESTEROL CAL: 60 mg/dL — AB (ref 5–40)

## 2016-08-15 LAB — TSH: TSH: 1.12 u[IU]/mL (ref 0.450–4.500)

## 2016-08-15 LAB — T4, FREE: FREE T4: 1.42 ng/dL (ref 0.82–1.77)

## 2016-08-17 ENCOUNTER — Telehealth: Payer: Self-pay | Admitting: Physician Assistant

## 2016-08-17 NOTE — Telephone Encounter (Signed)
Pt was seen Saturday and given a new medication and  she did not have the bottle in front so she didn't know the name but she has take two doses and her lip is swelling   Best number [phone]

## 2016-08-17 NOTE — Telephone Encounter (Signed)
Amlodipine was given and 2 hours after taking she had some swelling and numbness in lower lips.  Denies wheezes and sob   Was on a different b/p  And had a cough.   2 years ago her b/p med gave no problems   I advised to stop med and wait for advice.  If any resp distress needs ER

## 2016-08-18 MED ORDER — LOSARTAN POTASSIUM 50 MG PO TABS: 50.0000 mg | ORAL_TABLET | Freq: Every day | ORAL | 3 refills | Status: DC

## 2016-08-18 NOTE — Telephone Encounter (Signed)
Meds ordered this encounter  Medications  . losartan (COZAAR) 50 MG tablet    Sig: Take 1 tablet (50 mg total) by mouth daily.    Dispense:  90 tablet    Refill:  3    D/C amlodipine. lip swelling 2 hours after 1st dose    Order Specific Question:   Supervising Provider    Answer:   Clelia CroftSHAW, EVA N [[id number]]

## 2016-08-18 NOTE — Telephone Encounter (Signed)
Pt advised.

## 2016-08-21 MED ORDER — ROSUVASTATIN CALCIUM 20 MG PO TABS: 20.0000 mg | ORAL_TABLET | Freq: Every day | ORAL | 3 refills | Status: DC

## 2016-08-21 NOTE — Addendum Note (Signed)
Addended by: Porfirio OarJEFFERY, Amberlynn Tempesta on: 08/21/2016 01:42 PM   Modules accepted: Orders

## 2016-09-03 LAB — COLOGUARD: COLOGUARD: NEGATIVE

## 2016-09-11 ENCOUNTER — Encounter: Payer: Self-pay | Admitting: Physician Assistant

## 2016-09-11 ENCOUNTER — Ambulatory Visit (INDEPENDENT_AMBULATORY_CARE_PROVIDER_SITE_OTHER): Payer: Managed Care, Other (non HMO) | Admitting: Physician Assistant

## 2016-09-11 VITALS — BP 117/74 | HR 65 | Temp 98.1°F | Resp 18 | Ht 63.0 in | Wt 158.6 lb

## 2016-09-11 DIAGNOSIS — Z720 Tobacco use: Secondary | ICD-10-CM

## 2016-09-11 DIAGNOSIS — I10 Essential (primary) hypertension: Secondary | ICD-10-CM | POA: Diagnosis not present

## 2016-09-11 NOTE — Progress Notes (Signed)
Patient ID: Marissa Howard, female    DOB: [birth year], 61 y.o.   MRN: [id number]  PCP: Marissa Oar, PA-C  Chief Complaint  Patient presents with  . Hypertension    Last visit 08/14/2016 BP 160/84, Todays BP 117/74  . Follow-up    4 week follow-up    Subjective:   Presents for evaluation of HTN since adding amlodipine to her regimen of losartan.  Tolerating the addition of amlodipine well. Home readings are some improved, but she forgot the log today and can't remember any of the readings. No headaches, dizziness, CP, SOB. Has resumed smoking, 1/4 ppd. Plans to quit again, but is using it for stress relief.   Review of Systems As above.    Patient Active Problem List   Diagnosis Date Noted  . BMI 27.0-27.9,adult 08/14/2016  . Palpitations 06/15/2011  . Chest pain 05/19/2011  . HTN (hypertension) 03/29/2011  . Tobacco abuse 03/29/2011  . Elevated LFTs 12/17/2010  . Hyperlipemia 12/17/2010     Prior to Admission medications   Medication Sig Start Date End Date Taking? Authorizing Provider  cholecalciferol (VITAMIN D) 1000 UNITS tablet Take 1,000 Units by mouth daily. Reported on 10/01/2015   Yes [provider]  losartan (COZAAR) 50 MG tablet Take 1 tablet (50 mg total) by mouth daily. 08/18/16  Yes Jahmir Salo, PA-C  Multiple Vitamin (MULTIVITAMIN) tablet Take 1 tablet by mouth daily. Reported on 10/01/2015   Yes [provider]  OVER THE COUNTER MEDICATION OTC Krill Oil taking daily   Yes [provider]  amLODipine (NORVASC) 5 MG tablet  08/14/16   [provider]  rosuvastatin (CRESTOR) 10 MG tablet  07/29/16   [provider]     Allergies  Allergen Reactions  . Lisinopril Cough  . Tetracyclines & Related Other (See Comments)    Pt reports she gets "shakey"       Objective:  Physical Exam  Constitutional: She is oriented to person, place, and time. She appears well-developed and well-nourished. She is  active and cooperative. No distress.  BP 117/74 (BP Location: Right Arm, Patient Position: Sitting, Cuff Size: Normal)   Pulse 65   Temp 98.1 F (36.7 C) (Oral)   Resp 18   Ht 5\' 3"  (1.6 m)   Wt 158 lb 9.6 oz (71.9 kg)   SpO2 96%   BMI 28.09 kg/m   HENT:  Head: Normocephalic and atraumatic.  Right Ear: Hearing normal.  Left Ear: Hearing normal.  Eyes: Conjunctivae are normal. No scleral icterus.  Neck: Normal range of motion. Neck supple. No thyromegaly present.  Cardiovascular: Normal rate, regular rhythm and normal heart sounds.   Pulses:      Radial pulses are 2+ on the right side, and 2+ on the left side.  Pulmonary/Chest: Effort normal and breath sounds normal.  Lymphadenopathy:       Head (right side): No tonsillar, no preauricular, no posterior auricular and no occipital adenopathy present.       Head (left side): No tonsillar, no preauricular, no posterior auricular and no occipital adenopathy present.    She has no cervical adenopathy.       Right: No supraclavicular adenopathy present.       Left: No supraclavicular adenopathy present.  Neurological: She is alert and oriented to person, place, and time. No sensory deficit.  Skin: Skin is warm, dry and intact. No rash noted. No cyanosis or erythema. Nails show no clubbing.  Psychiatric: She has  a normal mood and affect. Her speech is normal and behavior is normal.           Assessment & Plan:   Problem List Items Addressed This Visit    HTN (hypertension) - Primary    Controlled today with current regimen. Continue. Encouraged her to stop smoking again, and to find an easy way to record her BP. Bring log with her next visit.      Relevant Medications   amLODipine (NORVASC) 5 MG tablet   rosuvastatin (CRESTOR) 10 MG tablet   Tobacco abuse       Return in about 3 months (around 12/12/2016) for Blood pressure follow-up.   Fernande Brashelle S. Mckaylah Bettendorf, PA-C [facility]

## 2016-09-11 NOTE — Patient Instructions (Addendum)
Continue taking your medications as they have been prescribed to you.  Continue taking your blood pressure at home. There are many options for recording your readings. You may continue to write them down on a notepad and bring them in to your appointments. You may also try downloading an iPhone app such as PulsePoint to record your readings.  It was a pleasure seeing you today!   Hypertension Hypertension is another name for high blood pressure. High blood pressure forces your heart to work harder to pump blood. This can cause problems over time. There are two numbers in a blood pressure reading. There is a top number (systolic) over a bottom number (diastolic). It is best to have a blood pressure below 120/80. Healthy choices can help lower your blood pressure. You may need medicine to help lower your blood pressure if:  Your blood pressure cannot be lowered with healthy choices.  Your blood pressure is higher than 130/80.  Follow these instructions at home: Eating and drinking  If directed, follow the DASH eating plan. This diet includes: ? Filling half of your plate at each meal with fruits and vegetables. ? Filling one quarter of your plate at each meal with whole grains. Whole grains include whole wheat pasta, brown rice, and whole grain bread. ? Eating or drinking low-fat dairy products, such as skim milk or low-fat yogurt. ? Filling one quarter of your plate at each meal with low-fat (lean) proteins. Low-fat proteins include fish, skinless chicken, eggs, beans, and tofu. ? Avoiding fatty meat, cured and processed meat, or chicken with skin. ? Avoiding premade or processed food.  Eat less than 1,500 mg of salt (sodium) a day.  Limit alcohol use to no more than 1 drink a day for nonpregnant women and 2 drinks a day for men. One drink equals 12 oz of beer, 5 oz of wine, or 1 oz of hard liquor. Lifestyle  Work with your doctor to stay at a healthy weight or to lose weight. Ask your  doctor what the best weight is for you.  Get at least 30 minutes of exercise that causes your heart to beat faster (aerobic exercise) most days of the week. This may include walking, swimming, or biking.  Get at least 30 minutes of exercise that strengthens your muscles (resistance exercise) at least 3 days a week. This may include lifting weights or pilates.  Do not use any products that contain nicotine or tobacco. This includes cigarettes and e-cigarettes. If you need help quitting, ask your doctor.  Check your blood pressure at home as told by your doctor.  Keep all follow-up visits as told by your doctor. This is important. Medicines  Take over-the-counter and prescription medicines only as told by your doctor. Follow directions carefully.  Do not skip doses of blood pressure medicine. The medicine does not work as well if you skip doses. Skipping doses also puts you at risk for problems.  Ask your doctor about side effects or reactions to medicines that you should watch for. Contact a doctor if:  You think you are having a reaction to the medicine you are taking.  You have headaches that keep coming back (recurring).  You feel dizzy.  You have swelling in your ankles.  You have trouble with your vision. Get help right away if:  You get a very bad headache.  You start to feel confused.  You feel weak or numb.  You feel faint.  You get very bad pain in  your: ? Chest. ? Belly (abdomen).  You throw up (vomit) more than once.  You have trouble breathing. Summary  Hypertension is another name for high blood pressure.  Making healthy choices can help lower blood pressure. If your blood pressure cannot be controlled with healthy choices, you may need to take medicine. This information is not intended to replace advice given to you by your health care provider. Make sure you discuss any questions you have with your health care provider. Document Released: 08/19/2007  Document Revised: 01/29/2016 Document Reviewed: 01/29/2016 Elsevier Interactive Patient Education  2018 [employer].     IF you received an x-ray today, you will receive an invoice from [facility]. Please contact [facility] at [phone] with questions or concerns regarding your invoice.   IF you received labwork today, you will receive an invoice from [facility]. Please contact [facility] at [phone] with questions or concerns regarding your invoice.   Our billing staff will not be able to assist you with questions regarding bills from these companies.  You will be contacted with the lab results as soon as they are available. The fastest way to get your results is to activate your My Chart account. Instructions are located on the last page of this paperwork. If you have not heard from [place] regarding the results in 2 weeks, please contact this office.

## 2016-09-11 NOTE — Assessment & Plan Note (Signed)
Controlled today with current regimen. Continue. Encouraged her to stop smoking again, and to find an easy way to record her BP. Bring log with her next visit.

## 2016-09-11 NOTE — Progress Notes (Signed)
   Subjective:    Patient ID: Marissa Howard, female    DOB: 07/14/1955, 61 y.o.   MRN: [id number] PCP: Porfirio OarJeffery, Chelle, PA-C  HPI Presenting for 4-week HTN follow-up.  Pt was last seen in office one month ago for an annual physical exam, at which time her BP was poorly controlled. Amlodipine was added to her medication regimen om addition to losartan 50mg .  Difficulty remembering home BP readings but reports they are lower this month than before. She believes systolics are <160. Cannot remember diastolics. She reports compliance with her medications.  Denies headaches or dizziness. Endorses chest pain maybe once a week, "less frequent than before" - states this is mostly at night and related to anxiety. Denies difficulty breathing and shortness of breath.   Pt continues to smoke 0.25 PPD but is interested in quitting in the future. Feels she has too much on her plate at the moment. Walks occasionally for exercise and working in the yard daily.   Patient Active Problem List   Diagnosis Date Noted  . BMI 27.0-27.9,adult 08/14/2016  . Palpitations 06/15/2011  . Chest pain 05/19/2011  . HTN (hypertension) 03/29/2011  . Former smoker 03/29/2011  . Elevated LFTs 12/17/2010  . Hyperlipemia 12/17/2010   Prior to Admission medications   Medication Sig Start Date End Date Taking? Authorizing Provider  cholecalciferol (VITAMIN D) 1000 UNITS tablet Take 1,000 Units by mouth daily. Reported on 10/01/2015   Yes [provider]  losartan (COZAAR) 50 MG tablet Take 1 tablet (50 mg total) by mouth daily. 08/18/16  Yes Jeffery, Chelle, PA-C  Multiple Vitamin (MULTIVITAMIN) tablet Take 1 tablet by mouth daily. Reported on 10/01/2015   Yes [provider]  OVER THE COUNTER MEDICATION OTC Krill Oil taking daily   Yes [provider]  rosuvastatin (CRESTOR) 20 MG tablet Take 1 tablet (20 mg total) by mouth daily. 08/21/16  Yes Porfirio OarJeffery, Chelle, PA-C   Allergies  Allergen  Reactions  . Lisinopril Cough  . Tetracyclines & Related Other (See Comments)    Pt reports she gets "shakey"     Review of Systems See above.    Objective:   Physical Exam  Constitutional: She appears well-developed and well-nourished.  BP 117/74 (BP Location: Right Arm, Patient Position: Sitting, Cuff Size: Normal)   Pulse 65   Temp 98.1 F (36.7 C) (Oral)   Resp 18   Ht 5\' 3"  (1.6 m)   Wt 158 lb 9.6 oz (71.9 kg)   SpO2 96%   BMI 28.09 kg/m    HENT:  Head: Normocephalic and atraumatic.  Right Ear: External ear normal.  Left Ear: External ear normal.  Eyes: Conjunctivae and EOM are normal.  Neck: Normal range of motion. Neck supple.  Cardiovascular: Normal rate, regular rhythm, normal heart sounds and intact distal pulses.   Pulmonary/Chest: Effort normal and breath sounds normal.  Neurological: She is alert.  Skin: Skin is warm and dry.  Psychiatric: She has a normal mood and affect. Her behavior is normal.       Assessment & Plan:   1. Essential hypertension Normotensive in office today. Discussed strategies for recording home BP readings and encouraged bringing these into the office. Follow up in 3 months.   2. Tobacco abuse Encouraged cessation. Advised calling when she feels ready.

## 2016-10-23 ENCOUNTER — Encounter: Payer: Self-pay | Admitting: Cardiovascular Disease

## 2016-10-23 ENCOUNTER — Ambulatory Visit (INDEPENDENT_AMBULATORY_CARE_PROVIDER_SITE_OTHER): Payer: Managed Care, Other (non HMO) | Admitting: Cardiovascular Disease

## 2016-10-23 VITALS — BP 130/70 | HR 73 | Ht 63.0 in | Wt 161.8 lb

## 2016-10-23 DIAGNOSIS — R002 Palpitations: Secondary | ICD-10-CM

## 2016-10-23 DIAGNOSIS — Z720 Tobacco use: Secondary | ICD-10-CM | POA: Diagnosis not present

## 2016-10-23 NOTE — Progress Notes (Signed)
Chief Complaint  Patient presents with  . New Patient (Initial Visit)   History of Present Illness: 61 yo female with history of HTN, HLD and tobacco abuse who is here today as a new patient to re-establish care in our office. She was last seen in our office in 2013. She was seen in 2013 for evaluation of chest pain and palpitations. Stress myoview in 2013 with no ischemia. Echo in 2013 with normal LV systolic function and no valve disease.   She tells me today that she has been doing well overall. She reports having had palpitations in January 2018 during the time of her mothers illness with dementia. This has resolved. She is limited somewhat by back pain. She is fairly active. She walks around her yard every day. No dyspnea. No chest pain. She still smokes 5-6 cigarettes per day.   Primary Care Physician: Porfirio OarJeffery, Chelle, PA-C  Past Medical History:  Diagnosis Date  . Hyperlipidemia   . Hypertension   . Palpitations 12/17/2010  . Tobacco abuse     Past Surgical History:  Procedure Laterality Date  . ABDOMINAL HYSTERECTOMY     partial  . BREAST LUMPECTOMY      Current Outpatient Prescriptions  Medication Sig Dispense Refill  . aspirin 325 MG tablet Take 325 mg by mouth 2 (two) times daily.    . cholecalciferol (VITAMIN D) 1000 UNITS tablet Take 1,000 Units by mouth daily. Reported on 10/01/2015    . losartan (COZAAR) 50 MG tablet Take 1 tablet (50 mg total) by mouth daily. 90 tablet 3  . Multiple Vitamin (MULTIVITAMIN) tablet Take 1 tablet by mouth daily. Reported on 10/01/2015    . OVER THE COUNTER MEDICATION OTC Krill Oil taking daily    . rosuvastatin (CRESTOR) 20 MG tablet Take 1 tablet by mouth daily.    . TURMERIC CURCUMIN PO Take 500 mg by mouth daily.     No current facility-administered medications for this visit.     Allergies  Allergen Reactions  . Lisinopril Cough  . Tetracyclines & Related Other (See Comments)    Pt reports she gets "shakey"    Social  History   Social History  . Marital status: Married    Spouse name: N/A  . Number of children: 3  . Years of education: high school   Occupational History  . unemployed     previous dispatcher at [employer]   Social History Main Topics  . Smoking status: Light Tobacco Smoker    Packs/day: 0.25    Years: 30.00    Types: Cigarettes    Last attempt to quit: 11/18/2014  . Smokeless tobacco: Never Used  . Alcohol use No     Comment: 1 wine cooler every 3 months  . Drug use: No  . Sexual activity: Yes   Other Topics Concern  . Not on file   Social History Narrative   Exercise: Walks daily for 1 hour.   Lives with her husband.   Her sister, Amil AmenJulia "Valla LeaverSuzanne" Kennerley, lives locally.    Family History  Problem Relation Age of Onset  . Heart murmur Mother   . Stroke Mother   . Dementia Mother   . Stroke Father   . Heart attack Paternal Grandfather   . Heart attack Maternal Grandmother   . Stroke Paternal Grandmother     Review of Systems:  As stated in the HPI and otherwise negative.   BP 130/70   Pulse 73   Ht 5'  3" (1.6 m)   Wt 161 lb 12.8 oz (73.4 kg)   SpO2 97%   BMI 28.66 kg/m   Physical Examination:  General: Well developed, well nourished, NAD  HEENT: OP clear, mucus membranes moist  SKIN: warm, dry. No rashes. Neuro: No focal deficits  Musculoskeletal: Muscle strength 5/5 all ext  Psychiatric: Mood and affect normal  Neck: No JVD, no carotid bruits, no thyromegaly, no lymphadenopathy.  Lungs:Clear bilaterally, no wheezes, rhonci, crackles Cardiovascular: Regular rate and rhythm. No murmurs, gallops or rubs. Abdomen:Soft. Bowel sounds present. Non-tender.  Extremities: No lower extremity edema. Pulses are 2 + in the bilateral DP/PT.  EKG:  EKG is ordered today. The ekg ordered today demonstrates NSR, rate 64 bpm  Recent Labs: 08/14/2016: ALT 29; BUN 11; Creatinine, Ser 0.90; Hemoglobin 16.4; Platelets 163; Potassium 4.6; Sodium 145; TSH 1.120    Lipid Panel    Component Value Date/Time   CHOL 232 (H) 08/14/2016 1507   TRIG 298 (H) 08/14/2016 1507   HDL 44 08/14/2016 1507   CHOLHDL 5.3 (H) 08/14/2016 1507   CHOLHDL 4.9 10/01/2015 1147   VLDL 44 (H) 10/01/2015 1147   LDLCALC 128 (H) 08/14/2016 1507     Wt Readings from Last 3 Encounters:  10/23/16 161 lb 12.8 oz (73.4 kg)  09/11/16 158 lb 9.6 oz (71.9 kg)  08/14/16 155 lb 12.8 oz (70.7 kg)     Other studies Reviewed: Additional studies/ records that were reviewed today include: . Review of the above records demonstrates:   Assessment and Plan:   1. Palpitations: Resolved. Occurred in setting of extreme stress with her mothers illness. Normal EKG. Avoid stimulants.   2. Tobacco abuse: smoking cessation encouraged.   Current medicines are reviewed at length with the patient today.  The patient does not have concerns regarding medicines.  The following changes have been made:  no change  Labs/ tests ordered today include:   Orders Placed This Encounter  Procedures  . EKG 12-Lead     Disposition:   FU with me in 24 months   Signed, Verne Carrow, MD 10/23/2016 10:23 AM    [facility] [address] [place], [place], [place]  [address] Phone: [phone]; Fax: [phone]

## 2016-10-23 NOTE — Patient Instructions (Signed)
Medication Instructions:  Your physician recommends that you continue on your current medications as directed. Please refer to the Current Medication list given to you today.   Labwork: none  Testing/Procedures: none  Follow-Up: Your physician wants you to follow-up in: 2 years.  You will receive a reminder letter in the mail two months in advance. If you don't receive a letter, please call our office to schedule the follow-up appointment.   Any Other Special Instructions Will Be Listed Below (If Applicable).     If you need a refill on your cardiac medications before your next appointment, please call your pharmacy.   

## 2016-11-04 ENCOUNTER — Encounter: Payer: Self-pay | Admitting: Physician Assistant

## 2016-11-09 DIAGNOSIS — Z0271 Encounter for disability determination: Secondary | ICD-10-CM

## 2016-12-18 ENCOUNTER — Encounter: Payer: Self-pay | Admitting: Physician Assistant

## 2016-12-18 ENCOUNTER — Ambulatory Visit (INDEPENDENT_AMBULATORY_CARE_PROVIDER_SITE_OTHER): Payer: Managed Care, Other (non HMO) | Admitting: Physician Assistant

## 2016-12-18 VITALS — BP 134/66 | Temp 98.0°F | Resp 16 | Ht 62.0 in | Wt 160.4 lb

## 2016-12-18 DIAGNOSIS — R0989 Other specified symptoms and signs involving the circulatory and respiratory systems: Secondary | ICD-10-CM | POA: Diagnosis not present

## 2016-12-18 DIAGNOSIS — Z720 Tobacco use: Secondary | ICD-10-CM

## 2016-12-18 DIAGNOSIS — Z23 Encounter for immunization: Secondary | ICD-10-CM | POA: Diagnosis not present

## 2016-12-18 DIAGNOSIS — I10 Essential (primary) hypertension: Secondary | ICD-10-CM | POA: Diagnosis not present

## 2016-12-18 DIAGNOSIS — F4320 Adjustment disorder, unspecified: Secondary | ICD-10-CM

## 2016-12-18 DIAGNOSIS — E785 Hyperlipidemia, unspecified: Secondary | ICD-10-CM

## 2016-12-18 MED ORDER — BUPROPION HCL ER (XL) 150 MG PO TB24: 150.0000 mg | ORAL_TABLET | Freq: Every day | ORAL | 3 refills | Status: DC

## 2016-12-18 NOTE — Assessment & Plan Note (Signed)
Encouraged smoking cessation. Bupropion, prescribed for situational mood, may be helpful.

## 2016-12-18 NOTE — Assessment & Plan Note (Signed)
Controlled. Continue current treatment. 

## 2016-12-18 NOTE — Assessment & Plan Note (Signed)
Await labs. Adjust regimen as indicated by results. New carotid bruits noted today may indicate need for more aggressive lipid lowering.

## 2016-12-18 NOTE — Assessment & Plan Note (Addendum)
New diagnosis today. Not noted on recent cardiology visit. Further treatment pending carotid artery duplex scans.

## 2016-12-18 NOTE — Progress Notes (Signed)
Patient ID: Marissa Howard, female    DOB: 1955/05/29, 61 y.o.   MRN: [id number]  PCP: Porfirio Oar, PA-C  Chief Complaint  Patient presents with  . Follow-up    patient presents for follow up on HTN, no medication refills at this time    Subjective:   Presents for evaluation of HTN and hyperlipidemia.  She is tolerating her current treatment well, no adverse effects and does not need refills at this time.  She is really struggling with caring for her mother, who has had multiple small strokes and has mild dementia. Her mother was a strict parent, and is now somewhat demanding, "She's used to what she wants, when she wants it." Her sister, long estranged from her due to an adversarial relationship with their mother, doesn't help. She is thinking about medical treatment, but wants to read about whatever is recommended before she takes it.  Smoking has increased since her mother's care has been more burdensome.  Exercising by swimming 3 times/week. It feels good, and she can tell she has less pain with walking, but doesn't think she's lost any weight.   Review of Systems No chest pain, SOB, HA, dizziness, vision change, N/V, diarrhea, constipation, dysuria, urinary urgency or frequency, new myalgias, arthralgias or rash.   Depression screen [facility] 2/9 12/18/2016 09/11/2016 08/14/2016 10/01/2015 06/18/2015  Decreased Interest 3 0 0 0 0  Down, Depressed, Hopeless 3 0 0 0 0  PHQ - 2 Score 6 0 0 0 0  Altered sleeping 0 - - - -  Tired, decreased energy 3 - - - -  Change in appetite 3 - - - -  Feeling bad or failure about yourself  3 - - - -  Trouble concentrating 3 - - - -  Moving slowly or fidgety/restless 0 - - - -  Suicidal thoughts 0 - - - -  PHQ-9 Score 18 - - - -  Difficult doing work/chores Somewhat difficult - - - -     Patient Active Problem List   Diagnosis Date Noted  . BMI 27.0-27.9,adult 08/14/2016  . Palpitations 06/15/2011  . Chest pain 05/19/2011  . HTN  (hypertension) 03/29/2011  . Tobacco abuse 03/29/2011  . Elevated LFTs 12/17/2010  . Hyperlipemia 12/17/2010     Prior to Admission medications   Medication Sig Start Date End Date Taking? Authorizing Provider  cholecalciferol (VITAMIN D) 1000 UNITS tablet Take 1,000 Units by mouth daily. Reported on 10/01/2015   Yes [provider]  losartan (COZAAR) 50 MG tablet Take 1 tablet (50 mg total) by mouth daily. 08/18/16  Yes Arrabella Westerman, PA-C  Multiple Vitamin (MULTIVITAMIN) tablet Take 1 tablet by mouth daily. Reported on 10/01/2015   Yes [provider]  OVER THE COUNTER MEDICATION OTC Krill Oil taking daily   Yes [provider]  rosuvastatin (CRESTOR) 20 MG tablet Take 1 tablet by mouth daily. 10/06/16  Yes [provider]  aspirin 325 MG tablet Take 325 mg by mouth 2 (two) times daily.    [provider]  TURMERIC CURCUMIN PO Take 500 mg by mouth daily.    [provider]     Allergies  Allergen Reactions  . Lisinopril Cough  . Tetracyclines & Related Other (See Comments)    Pt reports she gets "shakey"       Objective:  Physical Exam  Constitutional: She is oriented to person, place, and time. She appears well-developed and well-nourished. She is active and cooperative.  No distress.  BP 134/66 (BP Location: Left Arm, Patient Position: Sitting, Cuff Size: Normal)   Temp 98 F (36.7 C) (Oral)   Resp 16   Ht  (1.575 m)   Wt 160 lb 6.4 oz (72.8 kg)   BMI 29.34 kg/m   HENT:  Head: Normocephalic and atraumatic.  Right Ear: Hearing normal.  Left Ear: Hearing normal.  Eyes: Conjunctivae are normal. No scleral icterus.  Neck: Normal range of motion and phonation normal. Neck supple. Carotid bruit is present (bilateral). No thyromegaly present.  Cardiovascular: Normal rate, regular rhythm and normal heart sounds.   No murmur heard. Pulses:      Carotid pulses are 2+ on the right side, and 2+ on the left side.       Radial pulses are 2+ on the right side, and 2+ on the left side.  Pulmonary/Chest: Effort normal and breath sounds normal.  Lymphadenopathy:       Head (right side): No tonsillar, no preauricular, no posterior auricular and no occipital adenopathy present.       Head (left side): No tonsillar, no preauricular, no posterior auricular and no occipital adenopathy present.    She has no cervical adenopathy.       Right: No supraclavicular adenopathy present.       Left: No supraclavicular adenopathy present.  Neurological: She is alert and oriented to person, place, and time. No sensory deficit.  Skin: Skin is warm, dry and intact. No rash noted. No cyanosis or erythema. Nails show no clubbing.  Psychiatric: Her speech is normal and behavior is normal. Judgment and thought content normal. Her affect is labile. Cognition and memory are normal.   Wt Readings from Last 3 Encounters:  12/18/16 160 lb 6.4 oz (72.8 kg)  10/23/16 161 lb 12.8 oz (73.4 kg)  09/11/16 158 lb 9.6 oz (71.9 kg)       Assessment & Plan:   Problem List Items Addressed This Visit    HTN (hypertension) - Primary    Controlled. Continue current treatment.      Relevant Orders   CBC with Differential/Platelet   Comprehensive metabolic panel   TSH   Care order/instruction: (Completed)   Tobacco abuse    Encouraged smoking cessation. Bupropion, prescribed for situational mood, may be helpful.      Hyperlipemia    Await labs. Adjust regimen as indicated by results. New carotid bruits noted today may indicate need for more aggressive lipid lowering.      Relevant Orders   Comprehensive metabolic panel   Lipid panel   Bilateral carotid bruits    New diagnosis today. Not noted on recent cardiology visit. Further treatment pending carotid artery duplex scans.      Relevant Orders   US Carotid Duplex Bilateral    Other Visit Diagnoses    Adult situational stress disorder       Relevant Medications   buPROPion  (WELLBUTRIN XL) 150 MG 24 hr tablet   Need for influenza vaccination       She will obtain this at her local pharmacy.       Return for re-evalaution of mood, blood pressure, cholesterol in 6-12 weeks.   Fernande Bras, PA-C [facility] at [facility]

## 2016-12-18 NOTE — Patient Instructions (Signed)
     IF you received an x-ray today, you will receive an invoice from [facility]. Please contact [facility] at [phone] with questions or concerns regarding your invoice.   IF you received labwork today, you will receive an invoice from [facility]. Please contact [facility] at [phone] with questions or concerns regarding your invoice.   Our billing staff will not be able to assist you with questions regarding bills from these companies.  You will be contacted with the lab results as soon as they are available. The fastest way to get your results is to activate your My Chart account. Instructions are located on the last page of this paperwork. If you have not heard from us regarding the results in 2 weeks, please contact this office.     

## 2016-12-18 NOTE — Progress Notes (Signed)
Subjective:    Patient ID: Marissa Howard, female    DOB: [birth year], 61 y.o.   MRN: [id number]  HPI Patient returns today for a 3 month follow up on HTN, HLD, and tobacco abuse. 4 months ago, Amlodipine  was added on in addition to Losartan  to better manage blood pressure, but patient did not tolerate the Amlodipine well and it was discontinued after 3 days. Patient reports she has been compliant with her medications, taking them daily. She has been tolerating the Losartan and Rosuvastatin well. EKG from 10/23/2016 demonstrates normal sinus rhythm.   Patient is tearful today. Her mom was diagnosed in January with stroke and dementia; patient has been taking care of her, which has been very stressful. She reports her mother is "very self centered and set in her ways." She states that she does not feel like doing anything or going anywhere. She has not been checking her blood pressure at home. Patient has also been unemployed since February 2018, which adds financial burden to her life, living on one income from her husband.    She reports smoking less than a half a pack a day. She reports she has started swimming 3 times a week and tries to walk 1 mile a day.  Patient also complains of ocular migraine, which are triggered by stressful events. She states her eye doctor says she does not need to do anything for it.   Review of Systems  Constitutional: Negative for chills and fever.  HENT: Negative for tinnitus.   Eyes: Positive for visual disturbance.  Respiratory: Negative for shortness of breath.   Cardiovascular: Negative for chest pain and palpitations.  Gastrointestinal: Negative for abdominal pain, constipation, diarrhea, nausea and vomiting.  Musculoskeletal: Negative for myalgias.  Neurological: Negative for dizziness, syncope, weakness, light-headedness, numbness and headaches.   Patient Active Problem List   Diagnosis Date Noted  . BMI 27.0-27.9,adult 08/14/2016  .  Palpitations 06/15/2011  . Chest pain 05/19/2011  . HTN (hypertension) 03/29/2011  . Tobacco abuse 03/29/2011  . Elevated LFTs 12/17/2010  . Hyperlipemia 12/17/2010   Prior to Admission medications   Medication Sig Start Date End Date Taking? Authorizing Provider  cholecalciferol (VITAMIN D) 1000 UNITS tablet Take 1,000 Units by mouth daily. Reported on 10/01/2015   Yes [provider]  losartan (COZAAR) 50 MG tablet Take 1 tablet (50 mg total) by mouth daily. 08/18/16  Yes Jeffery, Chelle, PA-C  Multiple Vitamin (MULTIVITAMIN) tablet Take 1 tablet by mouth daily. Reported on 10/01/2015   Yes [provider]  OVER THE COUNTER MEDICATION OTC Krill Oil taking daily   Yes [provider]  rosuvastatin (CRESTOR) 20 MG tablet Take 1 tablet by mouth daily. 10/06/16  Yes [provider]  aspirin 325 MG tablet Take 325 mg by mouth 2 (two) times daily.    [provider]  TURMERIC CURCUMIN PO Take 500 mg by mouth daily.    [provider]   Allergies  Allergen Reactions  . Lisinopril Cough  . Tetracyclines & Related Other (See Comments)    Pt reports she gets "shakey"      Objective:   Physical Exam  Constitutional: She is oriented to person, place, and time. She appears well-developed and well-nourished. She appears distressed.  HENT:  Head: Normocephalic and atraumatic.  Right Ear: External ear normal.  Left Ear: External ear normal.  Neck: Neck supple. No JVD present. Carotid bruit is present (bilateral). No tracheal deviation present. No thyromegaly present.  Cardiovascular: Normal rate, regular rhythm, normal heart sounds and intact distal pulses.  Exam reveals no gallop and no friction rub.   No murmur heard. Pulmonary/Chest: Effort normal and breath sounds normal. No stridor. No respiratory distress. She has no wheezes. She has no rales. She exhibits no tenderness.  Lymphadenopathy:    She has no cervical adenopathy.  Neurological:  She is alert and oriented to person, place, and time.  Skin: Skin is warm and dry. No rash noted. She is not diaphoretic. No erythema. No pallor.      Assessment & Plan:  1. Essential hypertension - Controlled, continue on current Losartan  daily - CBC with Differential/Platelet - Comprehensive metabolic panel - TSH - Care order/instruction:  2. Hyperlipidemia, unspecified hyperlipidemia type - Continue on Rosuvastatin  daily - Comprehensive metabolic panel - Lipid panel  3. Tobacco abuse 4. Adult situational stress disorder - Patient is not interested in starting a SSRI at this time. - Prescription for Wellbutrin provided today, patient will let us know if she decides to start it.  - buPROPion (WELLBUTRIN XL) 150 MG 24 hr tablet; Take 1 tablet (150 mg total) by mouth daily.  Dispense: 30 tablet; Refill: 3  5. Need for influenza vaccination - Patient will obtain flu shot at local pharmacy.  6. Bilateral carotid bruits - US Carotid Duplex Bilateral; Future  Patient will follow up in 6 weeks if she decides to initiate Wellbutrin to monitor her response to medication. If not, follow up in 12 weeks to re-evaluate HTN, HLD, and tobacco abuse.    Respectfully, Gala Romney PA-S 2019

## 2016-12-19 LAB — COMPREHENSIVE METABOLIC PANEL
ALBUMIN: 5 g/dL — AB (ref 3.6–4.8)
ALK PHOS: 104 IU/L (ref 39–117)
ALT: 23 IU/L (ref 0–32)
AST: 24 IU/L (ref 0–40)
Albumin/Globulin Ratio: 2.5 — ABNORMAL HIGH (ref 1.2–2.2)
BUN/Creatinine Ratio: 16 (ref 12–28)
BUN: 16 mg/dL (ref 8–27)
Bilirubin Total: 0.5 mg/dL (ref 0.0–1.2)
CO2: 24 mmol/L (ref 20–29)
CREATININE: 1 mg/dL (ref 0.57–1.00)
Calcium: 10 mg/dL (ref 8.7–10.3)
Chloride: 101 mmol/L (ref 96–106)
GFR calc Af Amer: 69 mL/min/{1.73_m2} (ref >59)
GFR calc non Af Amer: 60 mL/min/{1.73_m2} (ref >59)
GLUCOSE: 99 mg/dL (ref 65–99)
Globulin, Total: 2 g/dL (ref 1.5–4.5)
Potassium: 4.4 mmol/L (ref 3.5–5.2)
Sodium: 139 mmol/L (ref 134–144)
Total Protein: 7 g/dL (ref 6.0–8.5)

## 2016-12-19 LAB — CBC WITH DIFFERENTIAL/PLATELET
BASOS ABS: 0 10*3/uL (ref 0.0–0.2)
Basos: 0 %
EOS (ABSOLUTE): 0.2 10*3/uL (ref 0.0–0.4)
Eos: 2 %
HEMOGLOBIN: 15.3 g/dL (ref 11.1–15.9)
Hematocrit: 46 % (ref 34.0–46.6)
Immature Grans (Abs): 0 10*3/uL (ref 0.0–0.1)
Immature Granulocytes: 0 %
LYMPHS ABS: 2.7 10*3/uL (ref 0.7–3.1)
Lymphs: 28 %
MCH: 32.4 pg (ref 26.6–33.0)
MCHC: 33.3 g/dL (ref 31.5–35.7)
MCV: 98 fL — ABNORMAL HIGH (ref 79–97)
MONOCYTES: 10 %
Monocytes Absolute: 1 10*3/uL — ABNORMAL HIGH (ref 0.1–0.9)
NEUTROS PCT: 60 %
Neutrophils Absolute: 5.9 10*3/uL (ref 1.4–7.0)
Platelets: 171 10*3/uL (ref 150–379)
RBC: 4.72 x10E6/uL (ref 3.77–5.28)
RDW: 13.8 % (ref 12.3–15.4)
WBC: 9.9 10*3/uL (ref 3.4–10.8)

## 2016-12-19 LAB — LIPID PANEL
CHOLESTEROL TOTAL: 191 mg/dL (ref 100–199)
Chol/HDL Ratio: 4.1 ratio (ref 0.0–4.4)
HDL: 47 mg/dL (ref >39)
LDL CALC: 113 mg/dL — AB (ref 0–99)
TRIGLYCERIDES: 156 mg/dL — AB (ref 0–149)
VLDL CHOLESTEROL CAL: 31 mg/dL (ref 5–40)

## 2016-12-19 LAB — TSH: TSH: 1.7 u[IU]/mL (ref 0.450–4.500)

## 2017-02-16 ENCOUNTER — Encounter: Payer: Self-pay | Admitting: Physician Assistant

## 2017-02-16 ENCOUNTER — Other Ambulatory Visit: Payer: Self-pay

## 2017-02-16 ENCOUNTER — Ambulatory Visit: Payer: Managed Care, Other (non HMO) | Admitting: Physician Assistant

## 2017-02-16 VITALS — BP 122/68 | HR 80 | Temp 98.0°F | Resp 16 | Ht 63.39 in | Wt 162.0 lb

## 2017-02-16 DIAGNOSIS — E785 Hyperlipidemia, unspecified: Secondary | ICD-10-CM | POA: Diagnosis not present

## 2017-02-16 DIAGNOSIS — F4320 Adjustment disorder, unspecified: Secondary | ICD-10-CM | POA: Insufficient documentation

## 2017-02-16 DIAGNOSIS — I10 Essential (primary) hypertension: Secondary | ICD-10-CM

## 2017-02-16 NOTE — Patient Instructions (Signed)
     IF you received an x-ray today, you will receive an invoice from [facility]. Please contact [facility] at [phone] with questions or concerns regarding your invoice.   IF you received labwork today, you will receive an invoice from [facility]. Please contact [facility] at [phone] with questions or concerns regarding your invoice.   Our billing staff will not be able to assist you with questions regarding bills from these companies.  You will be contacted with the lab results as soon as they are available. The fastest way to get your results is to activate your My Chart account. Instructions are located on the last page of this paperwork. If you have not heard from us regarding the results in 2 weeks, please contact this office.     

## 2017-02-16 NOTE — Progress Notes (Signed)
Patient ID: Marissa Howard, female    DOB: 03/10/1956, 61 y.o.   MRN: [id number]  PCP: Porfirio OarJeffery, Brady Schiller, PA-C  Chief Complaint  Patient presents with  . Hypertension    3 month follow-up   . Hyperlipidemia    Subjective:   Presents for evaluation of HTN and hyperlipidemia. She has had a cup of coffee today.  Feels well overall. Tolerating her medications without adverse effects.  Since starting bupropion XL 150 in October, she is doing much better. Still has some difficult days helping her mother. Feels like the dose is appropriate.  Has a little congestion. Drainage in the throat. Worse when she is lying down. Maybe worse when she lies on the RIGHT than on the LEFT.  Review of Systems As above. No CP, SOB, HA, dizziness, nausea, vomiting, diarrhea, constipation, vision changes, fever, chills.    Patient Active Problem List   Diagnosis Date Noted  . Bilateral carotid bruits 12/18/2016  . BMI 27.0-27.9,adult 08/14/2016  . Palpitations 06/15/2011  . Chest pain 05/19/2011  . HTN (hypertension) 03/29/2011  . Tobacco abuse 03/29/2011  . Elevated LFTs 12/17/2010  . Hyperlipemia 12/17/2010     Prior to Admission medications   Medication Sig Start Date End Date Taking? Authorizing Provider  buPROPion (WELLBUTRIN XL) 150 MG 24 hr tablet Take 1 tablet (150 mg total) by mouth daily. 12/18/16  Yes Genene Kilman, PA-C  losartan (COZAAR) 50 MG tablet Take 1 tablet (50 mg total) by mouth daily. 08/18/16  Yes Camiah Humm, PA-C  Multiple Vitamin (MULTIVITAMIN) tablet Take 1 tablet by mouth daily. Reported on 10/01/2015   Yes [provider]  OVER THE COUNTER MEDICATION OTC Krill Oil taking daily   Yes [provider]  rosuvastatin (CRESTOR) 20 MG tablet Take 1 tablet by mouth daily. 10/06/16  Yes [provider]     Allergies  Allergen Reactions  . Lisinopril Cough  . Tetracyclines & Related Other (See Comments)    Pt reports she gets "shakey"         Objective:  Physical Exam  Constitutional: She is oriented to person, place, and time. She appears well-developed and well-nourished. She is active and cooperative. No distress.  BP 122/68   Pulse 80   Temp 98 F (36.7 C) (Oral)   Resp 16   Ht 5' 3.39" (1.61 m)   Wt 162 lb (73.5 kg)   SpO2 96%   BMI 28.35 kg/m   HENT:  Head: Normocephalic and atraumatic.  Right Ear: Hearing normal.  Left Ear: Hearing normal.  Eyes: Conjunctivae are normal. No scleral icterus.  Neck: Normal range of motion. Neck supple. No thyromegaly present.  Cardiovascular: Normal rate, regular rhythm and normal heart sounds.  Pulses:      Radial pulses are 2+ on the right side, and 2+ on the left side.  Pulmonary/Chest: Effort normal and breath sounds normal.  Lymphadenopathy:       Head (right side): No tonsillar, no preauricular, no posterior auricular and no occipital adenopathy present.       Head (left side): No tonsillar, no preauricular, no posterior auricular and no occipital adenopathy present.    She has no cervical adenopathy.       Right: No supraclavicular adenopathy present.       Left: No supraclavicular adenopathy present.  Neurological: She is alert and oriented to person, place, and time. No sensory deficit.  Skin: Skin is warm, dry and intact. No rash noted. No cyanosis  or erythema. Nails show no clubbing.  Psychiatric: She has a normal mood and affect. Her speech is normal and behavior is normal.       Assessment & Plan:   Problem List Items Addressed This Visit    HTN (hypertension) - Primary    Well controlled. Continue current treatment.      Relevant Orders   CBC with Differential/Platelet   Comprehensive metabolic panel   Hyperlipemia    Await labs. Adjust regimen as indicated by results.      Relevant Orders   Comprehensive metabolic panel   Lipid panel   Adult situational stress disorder    Improved with addition of bupropion XL 150 mg daily. Continue.         Discussed the phlegm in her throat. ?GERD? Try OTC ranitidine.  Return in about 6 months (around 08/17/2017) for re-evaluation of blood pressure and fasting labs, sooner if needed for mood.   Fernande Brashelle S. Jaedon Siler, PA-C Primary Care at [facility]

## 2017-02-16 NOTE — Assessment & Plan Note (Signed)
Improved with addition of bupropion XL 150 mg daily. Continue.

## 2017-02-16 NOTE — Assessment & Plan Note (Signed)
Await labs. Adjust regimen as indicated by results.  

## 2017-02-16 NOTE — Assessment & Plan Note (Signed)
Well controlled. Continue current treatment. 

## 2017-02-17 LAB — CBC WITH DIFFERENTIAL/PLATELET
Basophils Absolute: 0 10*3/uL (ref 0.0–0.2)
Basos: 0 %
EOS (ABSOLUTE): 0.2 10*3/uL (ref 0.0–0.4)
Eos: 3 %
HEMOGLOBIN: 14.9 g/dL (ref 11.1–15.9)
Hematocrit: 44.6 % (ref 34.0–46.6)
IMMATURE GRANS (ABS): 0 10*3/uL (ref 0.0–0.1)
IMMATURE GRANULOCYTES: 0 %
LYMPHS: 23 %
Lymphocytes Absolute: 1.7 10*3/uL (ref 0.7–3.1)
MCH: 32.5 pg (ref 26.6–33.0)
MCHC: 33.4 g/dL (ref 31.5–35.7)
MCV: 97 fL (ref 79–97)
Monocytes Absolute: 0.9 10*3/uL (ref 0.1–0.9)
Monocytes: 12 %
NEUTROS PCT: 62 %
Neutrophils Absolute: 4.7 10*3/uL (ref 1.4–7.0)
PLATELETS: 154 10*3/uL (ref 150–379)
RBC: 4.58 x10E6/uL (ref 3.77–5.28)
RDW: 13.7 % (ref 12.3–15.4)
WBC: 7.5 10*3/uL (ref 3.4–10.8)

## 2017-02-17 LAB — COMPREHENSIVE METABOLIC PANEL
A/G RATIO: 2.1 (ref 1.2–2.2)
ALBUMIN: 4.8 g/dL (ref 3.6–4.8)
ALT: 27 IU/L (ref 0–32)
AST: 26 IU/L (ref 0–40)
Alkaline Phosphatase: 108 IU/L (ref 39–117)
BUN/Creatinine Ratio: 10 — ABNORMAL LOW (ref 12–28)
BUN: 11 mg/dL (ref 8–27)
Bilirubin Total: 0.4 mg/dL (ref 0.0–1.2)
CALCIUM: 9.7 mg/dL (ref 8.7–10.3)
CO2: 25 mmol/L (ref 20–29)
Chloride: 103 mmol/L (ref 96–106)
Creatinine, Ser: 1.12 mg/dL — ABNORMAL HIGH (ref 0.57–1.00)
GFR, EST AFRICAN AMERICAN: 60 mL/min/{1.73_m2} (ref >59)
GFR, EST NON AFRICAN AMERICAN: 52 mL/min/{1.73_m2} — AB (ref >59)
GLUCOSE: 93 mg/dL (ref 65–99)
Globulin, Total: 2.3 g/dL (ref 1.5–4.5)
Potassium: 4.4 mmol/L (ref 3.5–5.2)
Sodium: 140 mmol/L (ref 134–144)
TOTAL PROTEIN: 7.1 g/dL (ref 6.0–8.5)

## 2017-02-17 LAB — LIPID PANEL
CHOL/HDL RATIO: 4.4 ratio (ref 0.0–4.4)
Cholesterol, Total: 172 mg/dL (ref 100–199)
HDL: 39 mg/dL — AB (ref >39)
LDL Calculated: 78 mg/dL (ref 0–99)
Triglycerides: 273 mg/dL — ABNORMAL HIGH (ref 0–149)
VLDL CHOLESTEROL CAL: 55 mg/dL — AB (ref 5–40)

## 2017-05-01 ENCOUNTER — Other Ambulatory Visit: Payer: Self-pay | Admitting: Physician Assistant

## 2017-05-01 DIAGNOSIS — F4320 Adjustment disorder, unspecified: Secondary | ICD-10-CM

## 2017-05-01 NOTE — Telephone Encounter (Signed)
[facility]

## 2017-05-02 ENCOUNTER — Encounter: Payer: Self-pay | Admitting: Physician Assistant

## 2017-05-02 DIAGNOSIS — F4320 Adjustment disorder, unspecified: Secondary | ICD-10-CM

## 2017-05-03 MED ORDER — BUPROPION HCL ER (XL) 150 MG PO TB24: 150.0000 mg | ORAL_TABLET | Freq: Every day | ORAL | 3 refills | Status: DC

## 2017-05-03 NOTE — Addendum Note (Signed)
Addended by: Fernande BrasJEFFERY, Zoee Heeney S on: 05/03/2017 10:42 AM   Modules accepted: Orders

## 2017-05-11 ENCOUNTER — Encounter (HOSPITAL_COMMUNITY): Payer: Self-pay

## 2017-05-11 ENCOUNTER — Emergency Department (HOSPITAL_COMMUNITY): Payer: Managed Care, Other (non HMO)

## 2017-05-11 ENCOUNTER — Emergency Department (HOSPITAL_COMMUNITY)
Admission: EM | Admit: 2017-05-11 | Discharge: 2017-05-11 | Disposition: A | Discharge status: Home / Self Care | Payer: Managed Care, Other (non HMO) | Attending: Emergency Medicine | Admitting: Emergency Medicine

## 2017-05-11 DIAGNOSIS — E785 Hyperlipidemia, unspecified: Secondary | ICD-10-CM | POA: Insufficient documentation

## 2017-05-11 DIAGNOSIS — R079 Chest pain, unspecified: Secondary | ICD-10-CM | POA: Diagnosis present

## 2017-05-11 DIAGNOSIS — I10 Essential (primary) hypertension: Secondary | ICD-10-CM | POA: Insufficient documentation

## 2017-05-11 DIAGNOSIS — R0789 Other chest pain: Secondary | ICD-10-CM | POA: Insufficient documentation

## 2017-05-11 DIAGNOSIS — Z79899 Other long term (current) drug therapy: Secondary | ICD-10-CM | POA: Insufficient documentation

## 2017-05-11 DIAGNOSIS — F17210 Nicotine dependence, cigarettes, uncomplicated: Secondary | ICD-10-CM | POA: Diagnosis not present

## 2017-05-11 LAB — I-STAT TROPONIN, ED
TROPONIN I, POC: 0 ng/mL (ref 0.00–0.08)
Troponin i, poc: 0 ng/mL (ref 0.00–0.08)

## 2017-05-11 LAB — BASIC METABOLIC PANEL
ANION GAP: 11 (ref 5–15)
BUN: 12 mg/dL (ref 6–20)
CO2: 23 mmol/L (ref 22–32)
Calcium: 9.6 mg/dL (ref 8.9–10.3)
Chloride: 103 mmol/L (ref 101–111)
Creatinine, Ser: 1.1 mg/dL — ABNORMAL HIGH (ref 0.44–1.00)
GFR, EST NON AFRICAN AMERICAN: 52 mL/min — AB (ref >60)
Glucose, Bld: 99 mg/dL (ref 65–99)
Potassium: 4.2 mmol/L (ref 3.5–5.1)
SODIUM: 137 mmol/L (ref 135–145)

## 2017-05-11 LAB — CBC
HCT: 45.2 % (ref 36.0–46.0)
HEMOGLOBIN: 14.9 g/dL (ref 12.0–15.0)
MCH: 32 pg (ref 26.0–34.0)
MCHC: 33 g/dL (ref 30.0–36.0)
MCV: 97 fL (ref 78.0–100.0)
PLATELETS: 161 10*3/uL (ref 150–400)
RBC: 4.66 MIL/uL (ref 3.87–5.11)
RDW: 13.7 % (ref 11.5–15.5)
WBC: 9.1 10*3/uL (ref 4.0–10.5)

## 2017-05-11 NOTE — ED Provider Notes (Signed)
Patient placed in Quick Look pathway, seen and evaluated   Chief Complaint: Chest pain  HPI:   Patient presents with 2 episodes of left-sided substernal chest pain which radiated to the bilateral jaws.  Pain was dull in nature, lasted about 15 minutes each episode.  Initial episode around 3 PM, second episode around 3:30 PM patient was at rest watching television when this occurred.  Pain was nonexertional.  Associated symptoms include nausea and lightheadedness and mild shortness of breath at the time.  She states both arms felt weak.  She quit smoking in December.  She received 324 mg of aspirin at urgent care prior to arrival.  She is asymptomatic at this time.  No recent travel or surgeries, no hemoptysis, no prior history of DVT or PE, and she is not on estrogen therapy.  ROS: + for chest pain, shortness of breath, nausea, lightheadedness, weakness - for leg swelling, palpitations, fevers, chills  Physical Exam:   Gen: No distress  Neuro: Awake and Alert  Skin: Warm    Focused Exam: Regular rate and rhythm, no murmurs rubs or gallops normal S1-S2, 2+ radial and DP/PT pulses bl, negative Homan's bl.  Equal rise and fall of chest, no increased work of breathing.  No chest wall tenderness to palpation.  Initiation of care has begun. The patient has been counseled on the process, plan, and necessity for staying for the completion/evaluation, and the remainder of the medical screening examination    Jeanie SewerFawze, Tashiya Souders A, PA-C 05/11/17 1744    Pricilla LovelessGoldston, Scott, MD 05/12/17 [phone]

## 2017-05-11 NOTE — ED Provider Notes (Signed)
[facility] EMERGENCY DEPARTMENT Provider Note   CSN: [id number] Arrival date & time: 05/11/17  1718     History   Chief Complaint Chief Complaint  Patient presents with  . Chest Pain    HPI Marissa Howard is a 62 y.o. female.  62 yo F with a chief complaint of chest pain.  This been going on for the past 3 or 4 hours.  Happened while the patient was at rest.  Described it as a short fleeting pain but felt to radiate up into her neck.  Lasted for about 10 minutes resolved and then reoccurred.  This is now resolved.  The patient had no shortness of breath with this.  Denied diaphoresis or vomiting.  Denies lower extremity edema denies recent hospitalization denies estrogen use denies history of DVT or PE.  She has a history of hypertension hyperlipidemia.  She denies family history .  Quit smoking this new year.   The history is provided by the patient.  Chest Pain   This is a new problem. The current episode started 3 to 5 hours ago. The problem occurs constantly. The problem has not changed since onset.The pain is present in the substernal region. The pain is at a severity of 8/10. The pain is moderate. The quality of the pain is described as brief. The pain radiates to the left jaw. Duration of episode(s) is 6 hours. Associated symptoms include shortness of breath. Pertinent negatives include no dizziness, no fever, no headaches, no nausea, no palpitations and no vomiting. She has tried nothing for the symptoms. The treatment provided no relief. There are no known risk factors.    Past Medical History:  Diagnosis Date  . Hyperlipidemia   . Hypertension   . Palpitations 12/17/2010  . Tobacco abuse     Patient Active Problem List   Diagnosis Date Noted  . Adult situational stress disorder 02/16/2017  . Bilateral carotid bruits 12/18/2016  . BMI 27.0-27.9,adult 08/14/2016  . Palpitations 06/15/2011  . Chest pain 05/19/2011  . HTN (hypertension) 03/29/2011   . Tobacco abuse 03/29/2011  . Elevated LFTs 12/17/2010  . Hyperlipemia 12/17/2010    Past Surgical History:  Procedure Laterality Date  . ABDOMINAL HYSTERECTOMY     partial  . BREAST LUMPECTOMY      OB History    No data available       Home Medications    Prior to Admission medications   Medication Sig Start Date End Date Taking? Authorizing Provider  buPROPion (WELLBUTRIN XL) 150 MG 24 hr tablet Take 1 tablet (150 mg total) by mouth daily. 05/03/17  Yes Jeffery, Chelle, PA-C  glucosamine-chondroitin 500-400 MG tablet Take 1 tablet by mouth 3 (three) times daily.   Yes [provider]  losartan (COZAAR) 50 MG tablet Take 1 tablet (50 mg total) by mouth daily. 08/18/16  Yes Jeffery, Chelle, PA-C  Multiple Vitamin (MULTIVITAMIN) tablet Take 1 tablet by mouth daily. Reported on 10/01/2015   Yes [provider]  OVER THE COUNTER MEDICATION OTC Krill Oil taking daily   Yes [provider]  rosuvastatin (CRESTOR) 20 MG tablet Take 1 tablet by mouth daily. 10/06/16  Yes [provider]    Family History Family History  Problem Relation Age of Onset  . Heart murmur Mother   . Stroke Mother   . Dementia Mother   . Stroke Father   . Heart attack Paternal Grandfather   . Heart attack Maternal Grandmother   .  Stroke Paternal Grandmother     Social History Social History   Tobacco Use  . Smoking status: Light Tobacco Smoker    Packs/day: 0.25    Years: 30.00    Pack years: 7.50    Types: Cigarettes    Last attempt to quit: 11/18/2014    Years since quitting: 2.4  . Smokeless tobacco: Never Used  Substance Use Topics  . Alcohol use: No    Alcohol/week: 0.6 oz    Types: 1 Standard drinks or equivalent per week    Comment: 1 wine cooler every 3 months  . Drug use: No     Allergies   Lisinopril and Tetracyclines & related   Review of Systems Review of Systems  Constitutional: Negative for chills and fever.  HENT: Negative for  congestion and rhinorrhea.   Eyes: Negative for redness and visual disturbance.  Respiratory: Positive for shortness of breath. Negative for wheezing.   Cardiovascular: Positive for chest pain. Negative for palpitations.  Gastrointestinal: Negative for nausea and vomiting.  Genitourinary: Negative for dysuria and urgency.  Musculoskeletal: Negative for arthralgias and myalgias.  Skin: Negative for pallor and wound.  Neurological: Negative for dizziness and headaches.     Physical Exam Updated Vital Signs BP (!) 171/66   Pulse 76   Temp 97.8 F (36.6 C) (Oral)   Resp 16   Ht 5\' 2"  (1.575 m)   Wt 77.1 kg (170 lb)   SpO2 96%   BMI 31.09 kg/m   Physical Exam  Constitutional: She is oriented to person, place, and time. She appears well-developed and well-nourished. No distress.  HENT:  Head: Normocephalic and atraumatic.  Eyes: EOM are normal. Pupils are equal, round, and reactive to light.  Neck: Normal range of motion. Neck supple.  Cardiovascular: Normal rate and regular rhythm. Exam reveals no gallop and no friction rub.  No murmur heard. Pulmonary/Chest: Effort normal. She has no wheezes. She has no rales.  Abdominal: Soft. She exhibits no distension. There is no tenderness.  Musculoskeletal: She exhibits no edema or tenderness.  Neurological: She is alert and oriented to person, place, and time.  Skin: Skin is warm and dry. She is not diaphoretic.  Psychiatric: She has a normal mood and affect. Her behavior is normal.  Nursing note and vitals reviewed.    ED Treatments / Results  Labs (all labs ordered are listed, but only abnormal results are displayed) Labs Reviewed  BASIC METABOLIC PANEL - Abnormal; Notable for the following components:      Result Value   Creatinine, Ser 1.10 (*)    GFR calc non Af Amer 52 (*)    All other components within normal limits  CBC  I-STAT TROPONIN, ED  I-STAT TROPONIN, ED    EKG  EKG Interpretation  Date/Time:  Tuesday  May 11 2017 17:23:31 EST Ventricular Rate:  73 PR Interval:  154 QRS Duration: 66 QT Interval:  390 QTC Calculation: 429 R Axis:   2 Text Interpretation:  Normal sinus rhythm Cannot rule out Anterior infarct , age undetermined Abnormal ECG No significant change since last tracing Confirmed by Melene Plan [phone]) on 05/11/2017 6:58:20 PM       Radiology Dg Chest 2 View  Result Date: 05/11/2017 CLINICAL DATA:  Chest pain, left-sided EXAM: CHEST  2 VIEW COMPARISON:  Chest CT 10/01/2005 FINDINGS: Normal heart size and mediastinal contours. Interstitial coarsening. Borderline hyperinflation. There is no edema, consolidation, effusion, or pneumothorax. No acute osseous finding. Artifact from EKG leads. IMPRESSION:  No evidence of active disease. Electronically Signed   By: Marnee SpringJonathon  Watts M.D.   On: 05/11/2017 18:08    Procedures Procedures (including critical care time)  Medications Ordered in ED Medications - No data to display   Initial Impression / Assessment and Plan / ED Course  I have reviewed the triage vital signs and the nursing notes.  Pertinent labs & imaging results that were available during my care of the patient were reviewed by me and considered in my medical decision making (see chart for details).     62 yo F with a chief complaint of chest pain.  This is atypical in nature.  Heart score is 3.  I will obtain a delta troponin.  EKG is unremarkable.  Chest x-ray unremarkable.  Initial troponin negative.  Delta trop negative.  Continues to be pain free.  D/c home.   9:31 PM:  I have discussed the diagnosis/risks/treatment options with the patient and family and believe the pt to be eligible for discharge home to follow-up with PCP. We also discussed returning to the ED immediately if new or worsening sx occur. We discussed the sx which are most concerning (e.g., sudden worsening pain, fever, inability to tolerate by mouth) that necessitate immediate return. Medications  administered to the patient during their visit and any new prescriptions provided to the patient are listed below.  Medications given during this visit Medications - No data to display   The patient appears reasonably screen and/or stabilized for discharge and I doubt any other medical condition or other [facility] requiring further screening, evaluation, or treatment in the ED at this time prior to discharge.    Final Clinical Impressions(s) / ED Diagnoses   Final diagnoses:  Atypical chest pain    ED Discharge Orders    None       Melene PlanFloyd, Lennart Gladish, DO 05/11/17 2131

## 2017-05-11 NOTE — ED Triage Notes (Signed)
Pt reports chest Left sided chest pain that radiated to bilateral jaws. Pt reports the pain happened twice and only lasted about 15 mins each time. Pt received 324mg  ASA at [facility]. No pain at this time. Endorses sob at time of pain, bilateral arms felt "light", and nausea

## 2017-05-11 NOTE — Discharge Instructions (Signed)
Try zantac 150mg  twice a day.   Follow up with your PCP.  Return for worsening symptoms.

## 2017-05-11 NOTE — ED Notes (Signed)
Patient Alert and oriented to baseline. Stable and ambulatory to baseline. Patient verbalized understanding of the discharge instructions.  Patient belongings were taken by the patient.   

## 2017-05-19 ENCOUNTER — Telehealth: Payer: Self-pay | Admitting: Physician Assistant

## 2017-05-19 NOTE — Telephone Encounter (Signed)
Called pt to try and reschedule her appt from 09/01/17 with Porfirio Oarhelle Jeffery for a BP check up.  When pt calls in, please reschedule her with Porfirio Oarhelle Jeffery for an OV to recheck her BP.  Thanks!

## 2017-06-21 ENCOUNTER — Encounter: Payer: Self-pay | Admitting: Physician Assistant

## 2017-06-24 ENCOUNTER — Telehealth: Payer: Self-pay | Admitting: Physician Assistant

## 2017-06-24 NOTE — Telephone Encounter (Signed)
Called pt to try and reschedule them with a different provider [facility]. If pt calls back, please reschedule them with a different provider. Thanks!

## 2017-06-24 NOTE — Telephone Encounter (Signed)
Pt called in and I was able to get her scheduled with Chelle 08/10/17. She will be fasting in case Chelle would like to do any blood work.

## 2017-08-01 ENCOUNTER — Encounter: Payer: Self-pay | Admitting: Physician Assistant

## 2017-08-06 ENCOUNTER — Encounter: Payer: Self-pay | Admitting: Physician Assistant

## 2017-08-10 ENCOUNTER — Encounter: Payer: Self-pay | Admitting: Physician Assistant

## 2017-08-10 ENCOUNTER — Ambulatory Visit (INDEPENDENT_AMBULATORY_CARE_PROVIDER_SITE_OTHER): Payer: Managed Care, Other (non HMO) | Admitting: Physician Assistant

## 2017-08-10 ENCOUNTER — Other Ambulatory Visit: Payer: Self-pay

## 2017-08-10 VITALS — BP 130/68 | HR 82 | Temp 97.9°F | Resp 16 | Ht 62.6 in | Wt 174.4 lb

## 2017-08-10 DIAGNOSIS — R1011 Right upper quadrant pain: Secondary | ICD-10-CM

## 2017-08-10 DIAGNOSIS — E785 Hyperlipidemia, unspecified: Secondary | ICD-10-CM

## 2017-08-10 DIAGNOSIS — F4320 Adjustment disorder, unspecified: Secondary | ICD-10-CM | POA: Diagnosis not present

## 2017-08-10 DIAGNOSIS — I10 Essential (primary) hypertension: Secondary | ICD-10-CM

## 2017-08-10 DIAGNOSIS — Z87891 Personal history of nicotine dependence: Secondary | ICD-10-CM

## 2017-08-10 LAB — COMPREHENSIVE METABOLIC PANEL
A/G RATIO: 1.9 (ref 1.2–2.2)
ALBUMIN: 4.4 g/dL (ref 3.6–4.8)
ALK PHOS: 101 IU/L (ref 39–117)
ALT: 55 IU/L — ABNORMAL HIGH (ref 0–32)
AST: 35 IU/L (ref 0–40)
BUN / CREAT RATIO: 17 (ref 12–28)
BUN: 18 mg/dL (ref 8–27)
Bilirubin Total: 0.4 mg/dL (ref 0.0–1.2)
CO2: 20 mmol/L (ref 20–29)
CREATININE: 1.06 mg/dL — AB (ref 0.57–1.00)
Calcium: 9.6 mg/dL (ref 8.7–10.3)
Chloride: 108 mmol/L — ABNORMAL HIGH (ref 96–106)
GFR calc Af Amer: 65 mL/min/{1.73_m2} (ref >59)
GFR calc non Af Amer: 56 mL/min/{1.73_m2} — ABNORMAL LOW (ref >59)
GLOBULIN, TOTAL: 2.3 g/dL (ref 1.5–4.5)
Glucose: 107 mg/dL — ABNORMAL HIGH (ref 65–99)
POTASSIUM: 5 mmol/L (ref 3.5–5.2)
SODIUM: 145 mmol/L — AB (ref 134–144)
Total Protein: 6.7 g/dL (ref 6.0–8.5)

## 2017-08-10 LAB — LIPID PANEL
CHOL/HDL RATIO: 4.5 ratio — AB (ref 0.0–4.4)
CHOLESTEROL TOTAL: 197 mg/dL (ref 100–199)
HDL: 44 mg/dL (ref >39)
LDL CALC: 103 mg/dL — AB (ref 0–99)
TRIGLYCERIDES: 251 mg/dL — AB (ref 0–149)
VLDL Cholesterol Cal: 50 mg/dL — ABNORMAL HIGH (ref 5–40)

## 2017-08-10 MED ORDER — ROSUVASTATIN CALCIUM 20 MG PO TABS: 20.0000 mg | ORAL_TABLET | Freq: Every day | ORAL | 3 refills | Status: AC

## 2017-08-10 MED ORDER — BUPROPION HCL ER (SR) 150 MG PO TB12: 150.0000 mg | ORAL_TABLET | Freq: Two times a day (BID) | ORAL | 3 refills | Status: DC

## 2017-08-10 MED ORDER — LOSARTAN POTASSIUM 50 MG PO TABS: 50.0000 mg | ORAL_TABLET | Freq: Every day | ORAL | 3 refills | Status: DC

## 2017-08-10 MED ORDER — PANTOPRAZOLE SODIUM 40 MG PO TBEC: 40.0000 mg | DELAYED_RELEASE_TABLET | Freq: Every day | ORAL | 3 refills | Status: DC

## 2017-08-10 NOTE — Progress Notes (Signed)
Patient ID: Marissa Howard, female    DOB: 02-17-1956, 62 y.o.   MRN: [id number]  PCP: Porfirio Oar, PA-C  Chief Complaint  Patient presents with  . Hypertension    follow up, fasting     Subjective:   Presents for evaluation of HTN, situational stress.  Overall, is doing well.  Weight gain since quitting smoking. Skips breakfast and lunch. Has developed the munchies in the evenings. Is working on increasing the vegetables in her diet. Was swimming for exercise with her sister, but hasn't in a while. Looking for alternative locations for exercise.  Was in the ED in February with chest pain. Evaluation was negative for cardiac etiology. Since then, she wonders if that episode was due to a problem with her gallbladder.  Feels a painful "bubble" in the epigastrum and RUQ after eating. Increased belching, acidy. A heating pad helps. Lasts about 30 minutes, sometimes longer.  Sleep issues. Difficulty falling asleep, or staying asleep x 4 weeks. Not napping during the day. Doesn't occur every night.  Review of Systems As above.    Patient Active Problem List   Diagnosis Date Noted  . Adult situational stress disorder 02/16/2017  . Bilateral carotid bruits 12/18/2016  . BMI 27.0-27.9,adult 08/14/2016  . Palpitations 06/15/2011  . Chest pain 05/19/2011  . HTN (hypertension) 03/29/2011  . Tobacco abuse 03/29/2011  . Elevated LFTs 12/17/2010  . Hyperlipemia 12/17/2010     Prior to Admission medications   Medication Sig Start Date End Date Taking? Authorizing Provider  buPROPion (WELLBUTRIN SR) 150 MG 12 hr tablet Take 150 mg by mouth 2 (two) times daily.   Yes [provider]  glucosamine-chondroitin 500-400 MG tablet Take 1 tablet by mouth 3 (three) times daily.   Yes [provider]  losartan (COZAAR) 50 MG tablet Take 1 tablet (50 mg total) by mouth daily. 08/18/16  Yes Markeda Narvaez, PA-C  Multiple Vitamin (MULTIVITAMIN) tablet Take 1  tablet by mouth daily. Reported on 10/01/2015   Yes [provider]  OVER THE COUNTER MEDICATION OTC Krill Oil taking daily   Yes [provider]  rosuvastatin (CRESTOR) 20 MG tablet Take 1 tablet by mouth daily. 10/06/16  Yes [provider]     Allergies  Allergen Reactions  . Lisinopril Cough  . Tetracyclines & Related Other (See Comments)    Pt reports she gets "shakey"       Objective:  Physical Exam  Constitutional: She is oriented to person, place, and time. She appears well-developed and well-nourished. She is active and cooperative. No distress.  BP 130/68   Pulse 82   Temp 97.9 F (36.6 C)   Resp 16   Ht 5' 2.6" (1.59 m)   Wt 174 lb 6.4 oz (79.1 kg)   SpO2 95%   BMI 31.29 kg/m   HENT:  Head: Normocephalic and atraumatic.  Right Ear: Hearing normal.  Left Ear: Hearing normal.  Eyes: Conjunctivae are normal. No scleral icterus.  Neck: Normal range of motion. Neck supple. No thyromegaly present.  Cardiovascular: Normal rate, regular rhythm and normal heart sounds.  Pulses:      Radial pulses are 2+ on the right side, and 2+ on the left side.  Pulmonary/Chest: Effort normal and breath sounds normal.  Abdominal: Soft. Bowel sounds are normal. She exhibits no distension and no mass. There is no tenderness. There is no rebound and no guarding. No hernia.  Lymphadenopathy:       Head (right  side): No tonsillar, no preauricular, no posterior auricular and no occipital adenopathy present.       Head (left side): No tonsillar, no preauricular, no posterior auricular and no occipital adenopathy present.    She has no cervical adenopathy.       Right: No supraclavicular adenopathy present.       Left: No supraclavicular adenopathy present.  Neurological: She is alert and oriented to person, place, and time. No sensory deficit.  Skin: Skin is warm, dry and intact. No rash noted. No cyanosis or erythema. Nails show no clubbing.  Psychiatric: She has a  normal mood and affect. Her speech is normal and behavior is normal.    Wt Readings from Last 3 Encounters:  08/10/17 174 lb 6.4 oz (79.1 kg)  05/11/17 170 lb (77.1 kg)  02/16/17 162 lb (73.5 kg)       Assessment & Plan:   Problem List Items Addressed This Visit    Hyperlipemia    Await lab results.  Adjust regimen if indicated by lab results. In the meantime, continue rosuvastatin 20 mg daily.      Relevant Medications   losartan (COZAAR) 50 MG tablet   rosuvastatin (CRESTOR) 20 MG tablet   Other Relevant Orders   Comprehensive metabolic panel   Lipid panel   HTN (hypertension) - Primary    Controlled. Continue losartan.      Relevant Medications   losartan (COZAAR) 50 MG tablet   rosuvastatin (CRESTOR) 20 MG tablet   Other Relevant Orders   Comprehensive metabolic panel   Former smoker    Congratulated on smoking cessation!      Adult situational stress disorder    Persists, but stable. Continue bupropion XL 150 mg daily. COnsider d/c or increase dose at next visit, pending her continued life stressors, side effects.      Relevant Medications   buPROPion (WELLBUTRIN SR) 150 MG 12 hr tablet    Other Visit Diagnoses    RUQ abdominal pain       Relevant Medications   pantoprazole (PROTONIX) 40 MG tablet   Other Relevant Orders   US Abdomen Limited RUQ       Return in about 6 months (around 02/10/2018) for re-evaluation of cholesterol.   Fernande Bras, PA-C Primary Care at [facility]

## 2017-08-10 NOTE — Patient Instructions (Addendum)
Go ahead and call [facility] to schedule your next visit with me there. [phone].   IF you received an x-ray today, you will receive an invoice from [facility]. Please contact [facility] at [phone] with questions or concerns regarding your invoice.   IF you received labwork today, you will receive an invoice from [facility]. Please contact [facility] at [phone] with questions or concerns regarding your invoice.   Our billing staff will not be able to assist you with questions regarding bills from these companies.  You will be contacted with the lab results as soon as they are available. The fastest way to get your results is to activate your My Chart account. Instructions are located on the last page of this paperwork. If you have not heard from us regarding the results in 2 weeks, please contact this office.     

## 2017-08-10 NOTE — Assessment & Plan Note (Signed)
Persists, but stable. Continue bupropion XL 150 mg daily. COnsider d/c or increase dose at next visit, pending her continued life stressors, side effects.

## 2017-08-10 NOTE — Assessment & Plan Note (Signed)
Await lab results.  Adjust regimen if indicated by lab results. In the meantime, continue rosuvastatin 20 mg daily.

## 2017-08-10 NOTE — Assessment & Plan Note (Signed)
Congratulated on smoking cessation.  

## 2017-08-10 NOTE — Assessment & Plan Note (Signed)
Controlled. Continue losartan.

## 2017-08-17 ENCOUNTER — Encounter: Payer: Self-pay | Admitting: Physician Assistant

## 2017-08-18 ENCOUNTER — Other Ambulatory Visit: Payer: Self-pay | Admitting: Physician Assistant

## 2017-08-18 ENCOUNTER — Ambulatory Visit
Admission: RE | Admit: 2017-08-18 | Discharge: 2017-08-18 | Disposition: A | Discharge status: Home / Self Care | Payer: Managed Care, Other (non HMO) | Source: Ambulatory Visit | Attending: Physician Assistant | Admitting: Physician Assistant

## 2017-08-18 DIAGNOSIS — R945 Abnormal results of liver function studies: Principal | ICD-10-CM

## 2017-08-18 DIAGNOSIS — R1011 Right upper quadrant pain: Secondary | ICD-10-CM

## 2017-08-18 DIAGNOSIS — R7989 Other specified abnormal findings of blood chemistry: Secondary | ICD-10-CM

## 2017-08-20 ENCOUNTER — Ambulatory Visit: Payer: Managed Care, Other (non HMO) | Admitting: Physician Assistant

## 2017-09-01 ENCOUNTER — Ambulatory Visit: Payer: Managed Care, Other (non HMO) | Admitting: Physician Assistant

## 2017-09-03 ENCOUNTER — Ambulatory Visit: Payer: Managed Care, Other (non HMO) | Admitting: Gastroenterology

## 2017-09-03 ENCOUNTER — Encounter: Payer: Self-pay | Admitting: Gastroenterology

## 2017-09-03 VITALS — BP 130/78 | HR 71 | Ht 62.0 in | Wt 174.0 lb

## 2017-09-03 DIAGNOSIS — R0781 Pleurodynia: Secondary | ICD-10-CM | POA: Insufficient documentation

## 2017-09-03 DIAGNOSIS — R1011 Right upper quadrant pain: Secondary | ICD-10-CM | POA: Diagnosis not present

## 2017-09-03 NOTE — Progress Notes (Signed)
09/03/2017 Marissa Howard [id number] Sep 28, 1955   HISTORY OF PRESENT ILLNESS:  This is a pleasant 62 year old female who is new to our office and has been referred here by her PCP, Theora Gianotti, PA-C, for evaluation of RUQ abdominal pain.  Says that pain has been present for about 3-4 months.  Occurs about 30 mins after eating and can last for a couple of hours.  Describes pain as a swelling sensation.  was very severe, like a 9 or 10/10 on pain scale.  Has changed her diet, eating less fat, much cleaner, and has had minimal pain since changing diet.  Denies nausea, vomiting, fever, blood in stool.  Moves bowels well.  Ultrasound showed fatty liver.  No gallstones.  ALT slightly elevated at 55 but other LFT's normal.  Never saw a GI doctor in the past for any issues.  Never had colonoscopy.  Had negative Cologuard in 08/2016.   Past Medical History:  Diagnosis Date  . Hyperlipidemia   . Hypertension   . Palpitations 12/17/2010  . Tobacco abuse    Past Surgical History:  Procedure Laterality Date  . ABDOMINAL HYSTERECTOMY     partial  . BREAST LUMPECTOMY      reports that she quit smoking about 2 years ago. Her smoking use included cigarettes. She has a 7.50 pack-year smoking history. She quit smokeless tobacco use about 5 months ago. She reports that she does not drink alcohol or use drugs. family history includes Dementia in her mother; Heart attack in her maternal grandmother and paternal grandfather; Heart murmur in her mother; Stroke in her father, mother, and paternal grandmother. Allergies  Allergen Reactions  . Lisinopril Cough  . Tetracyclines & Related Other (See Comments)    Pt reports she gets "shakey"      Outpatient Encounter Medications as of 09/03/2017  Medication Sig  . buPROPion (WELLBUTRIN SR) 150 MG 12 hr tablet Take 1 tablet (150 mg total) by mouth 2 (two) times daily.  Marland Kitchen glucosamine-chondroitin 500-400 MG tablet Take 1 tablet by mouth 3 (three) times  daily.  Marland Kitchen losartan (COZAAR) 50 MG tablet Take 1 tablet (50 mg total) by mouth daily.  . Multiple Vitamin (MULTIVITAMIN) tablet Take 1 tablet by mouth daily. Reported on 10/01/2015  . OVER THE COUNTER MEDICATION OTC Krill Oil taking daily  . rosuvastatin (CRESTOR) 20 MG tablet Take 1 tablet (20 mg total) by mouth daily.  . [DISCONTINUED] pantoprazole (PROTONIX) 40 MG tablet Take 1 tablet (40 mg total) by mouth daily.   No facility-administered encounter medications on file as of 09/03/2017.      REVIEW OF SYSTEMS  : All other systems reviewed and negative except where noted in the History of Present Illness.   PHYSICAL EXAM: BP 130/78   Pulse 71   Ht 5\' 2"  (1.575 m)   Wt 174 lb (78.9 kg)   BMI 31.83 kg/m  General: Well developed white female in no acute distress Head: Normocephalic and atraumatic Eyes:  Sclerae anicteric, conjunctiva pink. Ears: Normal auditory acuity Lungs: Clear throughout to auscultation; no increased WOB. Heart: Regular rate and rhythm; no M/R/G. Abdomen: Soft, non-distended.  BS present.  Non-tender. Musculoskeletal: Symmetrical with no gross deformities  Skin: No lesions on visible extremities Extremities: No edema  Neurological: Alert oriented x 4, grossly non-focal Psychological:  Alert and cooperative. Normal mood and affect  ASSESSMENT AND PLAN: *RUQ abdominal pain:  Intermittent after eating fatty type foods.  Ultrasound normal except for fatty  liver.  LFT's normal except for mildly elevated ALT at 55, which most likely from fatty liver.  Still sounds like it could be biliary dysfunction.  Will check HIDA with CCK.   CC:  Porfirio OarJeffery, Chelle, PA-C

## 2017-09-03 NOTE — Progress Notes (Signed)
Thank you for sending this case to me. I have reviewed the entire note, and the outlined plan seems appropriate.  A HIDA scan is reasonable.  Amada JupiterHenry Danis, MD

## 2017-09-03 NOTE — Patient Instructions (Addendum)
If you are age 64 or older, your body mass index should be between 23-30. Your Body mass index is 31.83 kg/m. If this is out of the aforementioned range listed, please consider follow up with your Primary Care Provider.  If you are age 64 or younger, your body mass index should be between 19-25. Your Body mass index is 31.83 kg/m. If this is out of the aformentioned range listed, please consider follow up with your Primary Care Provider.    [facility] on 09/15/17 . Please arrive 15 minutes prior to your scheduled appointment at  8 am. Make certain not to have anything to eat or drink after midnight the day prior to your test. Should this appointment date or time not work well for you, please call radiology scheduling at [phone].  _____________________________________________________________________ hepatobiliary (HIDA) scan is an imaging procedure used to diagnose problems in the liver, gallbladder and bile ducts. In the HIDA scan, a radioactive chemical or tracer is injected into a vein in your arm. The tracer is handled by the liver like bile. Bile is a fluid produced and excreted by your liver that helps your digestive system break down fats in the foods you eat. Bile is stored in your gallbladder and the gallbladder releases the bile when you eat a meal. A special nuclear medicine scanner (gamma camera) tracks the flow of the tracer from your liver into your gallbladder and small intestine.  During your HIDA scan  You'll be asked to change into a hospital gown before your HIDA scan begins. Your health care team will position you on a table, usually on your back. The radioactive tracer is then injected into a vein in your arm.The tracer travels through your bloodstream to your liver, where it's taken up by the bile-producing cells. The radioactive tracer travels with the bile from your liver into your gallbladder and through your bile  ducts to your small intestine.You may feel some pressure while the radioactive tracer is injected into your vein. As you lie on the table, a special gamma camera is positioned over your abdomen taking pictures of the tracer as it moves through your body. The gamma camera takes pictures continually for about an hour. You'll need to keep still during the HIDA scan. This can become uncomfortable, but you may find that you can lessen the discomfort by taking deep breaths and thinking about other things. Tell your health care team if you're uncomfortable. The radiologist will watch on a computer the progress of the radioactive tracer through your body. The HIDA scan may be stopped when the radioactive tracer is seen in the gallbladder and enters your small intestine. This typically takes about an hour. In some cases extra imaging will be performed if original images aren't satisfactory, if morphine is given to help visualize the gallbladder or if the medication CCK is given to look at the contraction of the gallbladder. This test typically takes 2 hours to complete. ________________________________________________________________________    Thank you for choosing me and [facility].   Doug SouJessica Zehr, PA-C

## 2017-09-07 ENCOUNTER — Ambulatory Visit: Payer: Managed Care, Other (non HMO) | Admitting: Physician Assistant

## 2017-09-15 ENCOUNTER — Encounter (HOSPITAL_COMMUNITY)
Admission: RE | Admit: 2017-09-15 | Discharge: 2017-09-15 | Disposition: A | Discharge status: Home / Self Care | Payer: Managed Care, Other (non HMO) | Source: Ambulatory Visit | Attending: Gastroenterology | Admitting: Gastroenterology

## 2017-09-15 DIAGNOSIS — R1011 Right upper quadrant pain: Secondary | ICD-10-CM | POA: Diagnosis not present

## 2017-09-15 MED ORDER — TECHNETIUM TC 99M MEBROFENIN IV KIT
5.1000 | PACK | Freq: Once | INTRAVENOUS | Status: AC | PRN
  Administered 2017-09-15: 5.1 via INTRAVENOUS

## 2017-09-20 ENCOUNTER — Telehealth: Payer: Self-pay | Admitting: Gastroenterology

## 2017-09-20 NOTE — Telephone Encounter (Signed)
Marissa BumpsJessica the pt is calling for imaging results.  Please advise.

## 2017-09-21 ENCOUNTER — Encounter: Payer: Self-pay | Admitting: Gastroenterology

## 2017-09-21 NOTE — Telephone Encounter (Signed)
Let her know that HIDA scan was normal, but they did mention that she had pain with ingestion of Ensure.  Could still refer her to surgeons to see if they think her pain is biliary and if needs gallbladder removed.  Let me know what she thinks and how she is feeling.  Thank you,  Jess

## 2017-09-21 NOTE — Telephone Encounter (Signed)
The pt states she is feeling better and had no pain in a few days.  She says she will wait and if pain returns she will call back for surgical referral.

## 2017-09-30 DIAGNOSIS — M722 Plantar fascial fibromatosis: Secondary | ICD-10-CM | POA: Insufficient documentation

## 2017-10-15 ENCOUNTER — Encounter: Payer: Self-pay | Admitting: Gastroenterology

## 2017-10-25 NOTE — Telephone Encounter (Signed)
Is the referral ok to make?

## 2018-01-06 DIAGNOSIS — R7989 Other specified abnormal findings of blood chemistry: Secondary | ICD-10-CM | POA: Insufficient documentation

## 2018-01-24 ENCOUNTER — Other Ambulatory Visit: Payer: Self-pay | Admitting: Physician Assistant

## 2018-01-24 DIAGNOSIS — Z1231 Encounter for screening mammogram for malignant neoplasm of breast: Secondary | ICD-10-CM

## 2018-01-27 ENCOUNTER — Ambulatory Visit
Admission: RE | Admit: 2018-01-27 | Discharge: 2018-01-27 | Disposition: A | Discharge status: Home / Self Care | Payer: Managed Care, Other (non HMO) | Source: Ambulatory Visit | Attending: Physician Assistant | Admitting: Physician Assistant

## 2018-01-27 DIAGNOSIS — Z1231 Encounter for screening mammogram for malignant neoplasm of breast: Secondary | ICD-10-CM

## 2018-06-10 ENCOUNTER — Other Ambulatory Visit: Payer: Self-pay | Admitting: Nephrology

## 2018-06-10 DIAGNOSIS — N183 Chronic kidney disease, stage 3 unspecified: Secondary | ICD-10-CM

## 2018-06-13 ENCOUNTER — Ambulatory Visit
Admission: RE | Admit: 2018-06-13 | Discharge: 2018-06-13 | Disposition: A | Discharge status: Home / Self Care | Payer: Managed Care, Other (non HMO) | Source: Ambulatory Visit | Attending: Nephrology | Admitting: Nephrology

## 2018-06-13 ENCOUNTER — Other Ambulatory Visit: Payer: Self-pay

## 2018-06-13 DIAGNOSIS — N183 Chronic kidney disease, stage 3 unspecified: Secondary | ICD-10-CM

## 2019-03-30 ENCOUNTER — Other Ambulatory Visit: Payer: Self-pay | Admitting: Physician Assistant

## 2019-03-30 DIAGNOSIS — Z1231 Encounter for screening mammogram for malignant neoplasm of breast: Secondary | ICD-10-CM

## 2019-04-07 DIAGNOSIS — M810 Age-related osteoporosis without current pathological fracture: Secondary | ICD-10-CM | POA: Insufficient documentation

## 2019-05-18 ENCOUNTER — Ambulatory Visit: Payer: Medicare Other | Attending: Internal Medicine

## 2019-05-18 DIAGNOSIS — Z23 Encounter for immunization: Secondary | ICD-10-CM | POA: Insufficient documentation

## 2019-05-18 NOTE — Progress Notes (Signed)
   Covid-19 Vaccination Clinic  Name:  KALKIDAN CAUDELL    MRN: [id number] DOB: [birth year]  05/18/2019  Ms. Testerman was observed post Covid-19 immunization for 15 minutes without incident. She was provided with Vaccine Information Sheet and instruction to access the V-Safe system.   Ms. Mccary was instructed to call 911 with any severe reactions post vaccine: Marland Kitchen Difficulty breathing  . Swelling of face and throat  . A fast heartbeat  . A bad rash all over body  . Dizziness and weakness

## 2019-06-14 ENCOUNTER — Ambulatory Visit: Payer: Medicare Other | Attending: Internal Medicine

## 2019-06-14 DIAGNOSIS — Z23 Encounter for immunization: Secondary | ICD-10-CM

## 2019-06-14 NOTE — Progress Notes (Signed)
   Covid-19 Vaccination Clinic  Name:  Marissa Howard    MRN: [id number] DOB: August 23, 1955  06/14/2019  Marissa Howard was observed post Covid-19 immunization for 15 minutes without incident. She was provided with Vaccine Information Sheet and instruction to access the V-Safe system.   Marissa Howard was instructed to call 911 with any severe reactions post vaccine: Marland Kitchen Difficulty breathing  . Swelling of face and throat  . A fast heartbeat  . A bad rash all over body  . Dizziness and weakness   Immunizations Administered    Name Date Dose VIS Date Route   Pfizer COVID-19 Vaccine 06/14/2019  8:20 AM 0.3 mL 02/24/2019 Intramuscular   Manufacturer: [employer], [employer]   Lot: [id number]   NDC: [address]-0

## 2019-06-15 ENCOUNTER — Encounter: Payer: 59 | Attending: Physician Assistant | Admitting: Registered"

## 2019-06-15 ENCOUNTER — Other Ambulatory Visit: Payer: Self-pay

## 2019-06-15 ENCOUNTER — Encounter: Payer: Self-pay | Admitting: Registered"

## 2019-06-15 DIAGNOSIS — E669 Obesity, unspecified: Secondary | ICD-10-CM

## 2019-06-15 DIAGNOSIS — I10 Essential (primary) hypertension: Secondary | ICD-10-CM | POA: Diagnosis not present

## 2019-06-15 NOTE — Patient Instructions (Addendum)
Instructions/Goals:  Make sure to get in three meals per day. Try to have balanced meals like the My Plate example (see handout).   Continue with 3 meals per day  For sodium and saturated fat: limit those with 20% or more daily value on label  Include heart healthy unsaturated fats and limit those high in saturated fat   Limit foods very high in potassium until you receive an updated lab due to last one being elevated.   Continue with limiting added sugar and sodium   Goal: Practice Mindful Eating  At meal and snack times, put away electronics (TV, phone, tablet, etc.) and try to eat seated at a table so you can better focus on eating your meal/snack and promote listening to your body's fullness and hunger signals.  If you feel that you are wanting to snack because your are bored or due to emotions and not because you are hungry, try to do a fun activity (read a book, take a walk, talk with a friend, etc.) for at least 20 minutes.   Make list of go to activities: cross stitching, reading, puzzles, walking outdoors.   Make physical activity a part of your week. Try to include at least 30 minutes of physical activity 5 days each week or at least 150 minutes per week. Regular physical activity promotes overall health-including helping to reduce risk for heart disease and diabetes, promoting mental health, and helping [place] sleep better.    -Talk with you doctor about sleeping difficulties

## 2019-06-15 NOTE — Progress Notes (Signed)
Medical Nutrition Therapy:  Appt start time: 0836 end time:  0936.  Assessment:  Primary concerns today: Pt referred due to weight management and HTN. Pt present for appointment alone. Pt reports she just wants to lose some weight. Reports she gets very winded. Reports all weight has come on since 2018. 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 Reports she also lost her job during that time. Reports past weight of 118 lb. Pt reports she wants to get down to 120-125 lb. Reports she isn't a "snacker." Reports now snacking in the evening due to boredom. Doesn't know if weight gain is stress related. Pt reports she used to only eat 1 meal per day and over past ~3 years has changed to include eating 2 meals per day. Reports physical activity level has dropped over last few years. 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 Reports barrier to getting in activity now inclues plantar fascitis. Pt reports she went through menopause in mid 69s. Pt is currently helping her father in law.   Pt reports she does not add salt to foods. Pt does not like milk but does eat yogurt and takes a calcium supplement 2 times daily. Pt take first calcium supplement at breakfast and second with dinner.   Pt reports she enjoys reading, cross stitching and working puzzles.   Food Allergies/Intolerances: None reported.  GI Concerns: None reported.   Sleep Routine: Goes to bed around 11 PM and up by 7 AM. Reports waking 2-3 times each night. Reports low energy level.   Pertinent Lab Values: 01/06/2019: GFR: 58 (L) Creatinine: 1.02 (H) Potassium: 5.4 (H) Triglycerides: 188 (H) LDL Cholesterol: 103 (H)  Weight Hx: Reports being 118 lb in past. Gained weight since 2018 per pt report.   Preferred Learning Style:   No preference indicated   Learning Readiness:   Ready  MEDICATIONS: Reviewed.     DIETARY INTAKE:  Usual eating pattern includes 2-3 meals and occasionally 1 snack per day. Also reports sometimes snacking due to boredom in the evening. Yogurt in the morning, lunch salad or apple with peanut butter, and then dinner (varies).   Common foods: salmon, tuna, spinach, turnip greens, prepares a meat, starch and vegetable. Often does frozen vegetables, not much canned.  Avoided foods: cauliflower, brussels sprouts, quinoa. Eats most foods per pt. Really likes steak, baked potatoes.      Typical Snacks: celery, carrots, not often-ice cream, chips.    Typical Beverages: 6 oz x 4 cups coffee (small amount of sugar-reports cutting back, sometimes without sugar); 3 x 16 oz bottles water; occasionally half sweet, half unsweet tea.   Location of Meals: usually on couch in front of TV; lunch is at dining room table   Electronics Present at Mealtimes: Yes: TV  24-hr recall:  B ( AM): grilled chicken biscuit from [employer], half hashbrowns, coffee with small amount sugar  Snk ( AM): None reported.  L ( PM): peanut butter and banana sandwich, water  Snk ( PM): blueberry donut from [employer] D ( PM): apple, peanut butter, water Snk ( PM): None reported.  Beverages: 3 bottles water; 1 cup coffee  Usual physical activity: walking Minutes/Week: ~20 minutes x every other day. Has cut down amount of walking since foot problem. Works out at park couple times per week.   Progress Towards Goal(s):  In progress.  Nutritional Diagnosis:  NB-1.1 Food and nutrition-related knowledge deficit As related to no prior appointments with a registered dietitian .  As evidenced by pt seeking nutrition recommendations for weight loss and overall health.    Intervention:  Nutrition counseling provided. Reviewed pt's last lab work. Dietitian provided education on heart healthy, balanced nutrition and mindful eating. Discussed also limiting foods high in potassium due to last potassium lab being  elevated at 5.4. Pt reports she will be getting updated lab work within next month. Recommended pt talk with her doctor about ongoing sleep issues as inadequate sleep which can affect many aspects of health and life. Pt appeared agreeable to information/goals discussed.   Instructions/Goals:  Make sure to get in three meals per day. Try to have balanced meals like the My Plate example (see handout).   Continue with 3 meals per day  For sodium and saturated fat: limit those with 20% or more daily value on label  Include heart healthy unsaturated fats and limit those high in saturated fat   Limit foods very high in potassium until you receive an updated lab due to last one being elevated.   Continue with limiting added sugar and sodium   Goal: Practice Mindful Eating  At meal and snack times, put away electronics (TV, phone, tablet, etc.) and try to eat seated at a table so you can better focus on eating your meal/snack and promote listening to your body's fullness and hunger signals.  If you feel that you are wanting to snack because your are bored or due to emotions and not because you are hungry, try to do a fun activity (read a book, take a walk, talk with a friend, etc.) for at least 20 minutes.   Make list of go to activities: cross stitching, reading, puzzles, walking outdoors.   Make physical activity a part of your week. Try to include at least 30 minutes of physical activity 5 days each week or at least 150 minutes per week. Regular physical activity promotes overall health-including helping to reduce risk for heart disease and diabetes, promoting mental health, and helping [place] sleep better.    -Talk with you doctor about sleeping difficulties  Teaching Method Utilized:  Visual Auditory  Handouts given during visit include:  Balanced plate and food list.   Heart Healthy Nutrition.   ADA Exercises   Barriers to learning/adherence to lifestyle change: None indicated.    Demonstrated degree of understanding via:  Teach Back   Monitoring/Evaluation:  Dietary intake, exercise, and body weight in 1 month(s).

## 2019-06-29 NOTE — Progress Notes (Signed)
Chief Complaint  Patient presents with  . Follow-up    Palpitations    History of Present Illness: 64 yo female with history of HTN, HLD and tobacco abuse who is here today for cardiac follow up.She was seen in 2013 for evaluation of chest pain and palpitations. Stress myoview in 2013 with no ischemia. Echo in 2013 with normal LV systolic function and no valve disease. I saw her back in the office in August 2018 and she was doing well.   He is here today for follow up. The patient denies any chest pain, dyspnea, palpitations, lower extremity edema, orthopnea, PND, dizziness, near syncope or syncope. She quit smoking in 2018.  Primary Care Physician: Bernerd Limbo, MD  Past Medical History:  Diagnosis Date  . Hyperlipidemia   . Hypertension   . Palpitations 12/17/2010  . Tobacco abuse     Past Surgical History:  Procedure Laterality Date  . ABDOMINAL HYSTERECTOMY     partial  . BREAST EXCISIONAL BIOPSY Right over 10 years  . BREAST LUMPECTOMY      Current Outpatient Medications  Medication Sig Dispense Refill  . Ascorbic Acid (VITAMIN C PO) Take by mouth.    . Cyanocobalamin (B-12 PO) Take by mouth.    . losartan (COZAAR) 50 MG tablet Take 1 tablet (50 mg total) by mouth daily. 90 tablet 3  . losartan (COZAAR) 50 MG tablet Take 50 mg by mouth in the morning and at bedtime.    . Multiple Vitamin (MULTIVITAMIN) tablet Take 1 tablet by mouth daily. Reported on 10/01/2015    . Multiple Vitamins-Minerals (ZINC PO) Take by mouth.    Marland Kitchen OVER THE COUNTER MEDICATION OTC Krill Oil taking daily    . rosuvastatin (CRESTOR) 20 MG tablet Take 1 tablet (20 mg total) by mouth daily. 90 tablet 3  . VITAMIN D PO Take by mouth.     No current facility-administered medications for this visit.    Allergies  Allergen Reactions  . Lisinopril Cough  . Tetracyclines & Related Other (See Comments)    Pt reports she gets "shakey"    Social History   Socioeconomic History  . Marital status:  Married    Spouse name: Grayland Ormond  . Number of children: 3  . Years of education: high school  . Highest education level: 12th grade  Occupational History  . Occupation: unemployed    Comment: previous Counsellor at [employer]  . 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 Smoking status: Former Smoker    Packs/day: 0.25    Years: 30.00    Pack years: 7.50    Types: Cigarettes    Quit date: 11/18/2014    Years since quitting: 4.6  . Smokeless tobacco: Former Systems developer    Quit date: 03/12/2017  Substance and Sexual Activity  . Alcohol use: No    Alcohol/week: 1.0 standard drinks    Types: 1 Standard drinks or equivalent per week    Comment: 1 wine cooler every 3 months  . Drug use: No  . Sexual activity: Yes  Other Topics Concern  . Not on file  Social History Narrative   Exercise: Walks daily for 1 hour.   Lives with her husband.   Her sister, Gregary Signs "Theora Master, lives locally.   Social Determinants of Health   Financial Resource Strain:   . Difficulty of Paying Living Expenses:   Food Insecurity:   . Worried About Charity fundraiser in the  Last Year:   . Ran Out of Food in the Last Year:   Transportation Needs:   . Freight forwarder (Medical):   Marland Kitchen Lack of Transportation (Non-Medical):   Physical Activity:   . Days of Exercise per Week:   . Minutes of Exercise per Session:   Stress:   . Feeling of Stress :   Social Connections:   . Frequency of Communication with Friends and Family:   . Frequency of Social Gatherings with Friends and Family:   . Attends Religious Services:   . Active Member of Clubs or Organizations:   . Attends Banker Meetings:   Marland Kitchen Marital Status:   Intimate Partner Violence:   . Fear of Current or Ex-Partner:   . Emotionally Abused:   Marland Kitchen Physically Abused:   . Sexually Abused:     Family History  Problem Relation Age of Onset  . Heart murmur Mother   . Stroke Mother   . Dementia Mother   . Stroke  Father   . Heart attack Paternal Grandfather   . Heart attack Maternal Grandmother   . Stroke Paternal Grandmother     Review of Systems:  As stated in the HPI and otherwise negative.   BP (!) 146/80   Pulse 74   Ht 5\' 2"  (1.575 m)   Wt 188 lb 12.8 oz (85.6 kg)   SpO2 96%   BMI 34.53 kg/m   Physical Examination:  General: Well developed, well nourished, NAD  HEENT: OP clear, mucus membranes moist  SKIN: warm, dry. No rashes. Neuro: No focal deficits  Musculoskeletal: Muscle strength 5/5 all ext  Psychiatric: Mood and affect normal  Neck: No JVD, no carotid bruits, no thyromegaly, no lymphadenopathy.  Lungs:Clear bilaterally, no wheezes, rhonci, crackles Cardiovascular: Regular rate and rhythm. No murmurs, gallops or rubs. Abdomen:Soft. Bowel sounds present. Non-tender.  Extremities: No lower extremity edema. Pulses are 2 + in the bilateral DP/PT.  EKG:  EKG is ordered today. The ekg ordered today demonstrates NSR, rate 74 bpm  Recent Labs: No results found for requested labs within last 8760 hours.   Lipid Panel    Component Value Date/Time   CHOL 197 08/10/2017 0915   TRIG 251 (H) 08/10/2017 0915   HDL 44 08/10/2017 0915   CHOLHDL 4.5 (H) 08/10/2017 0915   CHOLHDL 4.9 10/01/2015 1147   VLDL 44 (H) 10/01/2015 1147   LDLCALC 103 (H) 08/10/2017 0915     Wt Readings from Last 3 Encounters:  06/30/19 188 lb 12.8 oz (85.6 kg)  06/15/19 188 lb 1.6 oz (85.3 kg)  09/03/17 174 lb (78.9 kg)     Other studies Reviewed: Additional studies/ records that were reviewed today include: . Review of the above records demonstrates:   Assessment and Plan:   1. Palpitations: No recent palpitations.  2. 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   3. HTN: BP is slightly elevated today but has been well controlled at home. She will follow at home. Her BP is managed in primary care.   Current medicines are reviewed at length with the patient today.  The patient  does not have concerns regarding medicines.  The following changes have been made:  no change  Labs/ tests ordered today include:   Orders Placed This Encounter  Procedures  . EKG 12-Lead    Disposition:   FU with me in 24 months   Signed, 2019, MD 06/30/2019 10:59 AM    [facility] [address], [address] [place], [place]  [address] Phone: [phone]; Fax: [phone]

## 2019-06-30 ENCOUNTER — Other Ambulatory Visit: Payer: Self-pay

## 2019-06-30 ENCOUNTER — Encounter: Payer: Self-pay | Admitting: Cardiovascular Disease

## 2019-06-30 ENCOUNTER — Ambulatory Visit (INDEPENDENT_AMBULATORY_CARE_PROVIDER_SITE_OTHER): Payer: 59 | Admitting: Cardiovascular Disease

## 2019-06-30 VITALS — BP 146/80 | HR 74 | Ht 62.0 in | Wt 188.8 lb

## 2019-06-30 DIAGNOSIS — I10 Essential (primary) hypertension: Secondary | ICD-10-CM | POA: Diagnosis not present

## 2019-06-30 DIAGNOSIS — R002 Palpitations: Secondary | ICD-10-CM | POA: Diagnosis not present

## 2019-06-30 NOTE — Patient Instructions (Signed)
Medication Instructions:  No changes *If you need a refill on your cardiac medications before your next appointment, please call your pharmacy*   Lab Work: none If you have labs (blood work) drawn today and your tests are completely normal, you will receive your results only by: Marland Kitchen MyChart Message (if you have MyChart) OR . A paper copy in the mail If you have any lab test that is abnormal or we need to change your treatment, we will call you to review the results.   Testing/Procedures: none   Follow-Up: At [facility], you and your health needs are our priority.  As part of our continuing mission to provide you with exceptional heart care, we have created designated Provider Care Teams.  These Care Teams include your primary Cardiologist (physician) and Advanced Practice Providers (APPs -  Physician Assistants and Nurse Practitioners) who all work together to provide you with the care you need, when you need it.  Your next appointment:   2 year(s)  The format for your next appointment:   In Person  Provider:   Verne Carrow, MD   Other Instructions

## 2019-07-07 ENCOUNTER — Encounter: Payer: Self-pay | Admitting: Sports Medicine

## 2019-07-07 ENCOUNTER — Ambulatory Visit (INDEPENDENT_AMBULATORY_CARE_PROVIDER_SITE_OTHER): Payer: Managed Care, Other (non HMO) | Admitting: Sports Medicine

## 2019-07-07 ENCOUNTER — Other Ambulatory Visit: Payer: Self-pay | Admitting: Sports Medicine

## 2019-07-07 ENCOUNTER — Ambulatory Visit (INDEPENDENT_AMBULATORY_CARE_PROVIDER_SITE_OTHER): Payer: Medicare Other

## 2019-07-07 ENCOUNTER — Other Ambulatory Visit: Payer: Self-pay

## 2019-07-07 DIAGNOSIS — M216X1 Other acquired deformities of right foot: Secondary | ICD-10-CM | POA: Diagnosis not present

## 2019-07-07 DIAGNOSIS — M79671 Pain in right foot: Secondary | ICD-10-CM

## 2019-07-07 DIAGNOSIS — M722 Plantar fascial fibromatosis: Secondary | ICD-10-CM

## 2019-07-07 DIAGNOSIS — M779 Enthesopathy, unspecified: Secondary | ICD-10-CM | POA: Diagnosis not present

## 2019-07-07 MED ORDER — TRIAMCINOLONE ACETONIDE 10 MG/ML IJ SUSP
10.0000 mg | Freq: Once | INTRAMUSCULAR | Status: AC
  Administered 2019-07-07: 10 mg

## 2019-07-07 NOTE — Progress Notes (Signed)
Subjective: Marissa Howard is a 64 y.o. female patient presents to office with complaint of moderate heel pain on the right for the last year that radiates to the lateral side occasionally reports that it is worse in the morning with the first few steps out of bed or after a period of sitting when she has been doing her cross stitching.  Patient reports that she has been icing elevating taking Advil stretching and using her splint without improvement.  Denies any other pedal complaints.   Review of Systems  All other systems reviewed and are negative.   Patient Active Problem List   Diagnosis Date Noted  . Age-related osteoporosis without current pathological fracture 04/07/2019  . Elevated serum creatinine 01/06/2018  . Plantar fasciitis, bilateral 09/30/2017  . RUQ abdominal pain 09/03/2017  . Rib pain on right side 09/03/2017  . Adult situational stress disorder 02/16/2017  . Bilateral carotid bruits 12/18/2016  . BMI 31.0-31.9,adult 08/14/2016  . Palpitations 06/15/2011  . Chest pain 05/19/2011  . HTN (hypertension) 03/29/2011  . Elevated LFTs 12/17/2010  . Hyperlipemia 12/17/2010    Current Outpatient Medications on File Prior to Visit  Medication Sig Dispense Refill  . Ascorbic Acid (VITAMIN C PO) Take by mouth.    . Cyanocobalamin (B-12 PO) Take by mouth.    . losartan (COZAAR) 50 MG tablet Take 50 mg by mouth in the morning and at bedtime.    . Multiple Vitamin (MULTIVITAMIN) tablet Take 1 tablet by mouth daily. Reported on 10/01/2015    . Multiple Vitamins-Minerals (ZINC PO) Take by mouth.    Marland Kitchen OVER THE COUNTER MEDICATION OTC Krill Oil taking daily    . rosuvastatin (CRESTOR) 20 MG tablet Take 1 tablet (20 mg total) by mouth daily. 90 tablet 3  . VITAMIN D PO Take by mouth.     No current facility-administered medications on file prior to visit.    Allergies  Allergen Reactions  . Lisinopril Cough  . Tetracyclines & Related Other (See Comments)    Pt reports she  gets "shakey"    Objective: Physical Exam General: The patient is alert and oriented x3 in no acute distress.  Dermatology: Skin is warm, dry and supple bilateral lower extremities. Nails 1-10 are normal. There is no erythema, edema, no eccymosis, no open lesions present. Integument is otherwise unremarkable.  Vascular: Dorsalis Pedis pulse and Posterior Tibial pulse are 2/4 bilateral. Capillary fill time is immediate to all digits.  Neurological: Grossly intact to light touch with an achilles reflex of +2/5 and a  negative Tinel's sign bilateral.  Musculoskeletal: Tenderness to palpation at the medial calcaneal tubercale and through the insertion of the plantar fascia on the right foot, subjective pulling sensation noted to the Achilles insertion on the right. No pain with compression of calcaneus bilateral. No pain with tuning fork to calcaneus bilateral. No pain with calf compression bilateral. There is decreased Ankle joint range of motion bilateral. All other joints range of motion within normal limits bilateral. Strength 5/5 in all groups bilateral.   Gait: Unassisted, Antalgic avoid weight on left/right heel  Xray, Right foot: Normal osseous mineralization. Joint spaces preserved. No fracture/dislocation/boney destruction. Calcaneal spur present with mild thickening of plantar fascia. No other soft tissue abnormalities or radiopaque foreign bodies.   Assessment and Plan: Problem List Items Addressed This Visit    None    Visit Diagnoses    Plantar fasciitis of right foot    -  Primary   Tendinitis  Acquired equinus deformity of right foot       Pain of right heel          -Complete examination performed.  -Xrays reviewed -Discussed with patient in detail the condition of plantar fasciitis, how this occurs and general treatment options. Explained both conservative and surgical treatments.  -After oral consent and aseptic prep, injected a mixture containing 1 ml of 2%   plain lidocaine, 1 ml 0.5% plain marcaine, 0.5 ml of kenalog 10 and 0.5 ml of dexamethasone phosphate into right heel. Post-injection care discussed with patient.  -Recommended good supportive shoes and heel cushions -Continue with night splint and instructed patient on proper use -Recommend patient to ice affected area 1-2x daily. -Continue with over-the-counter anti-inflammatories as needed -Patient to return to office in 3-4 weeks for follow up or sooner if problems or questions arise.  Asencion Islam, DPM

## 2019-07-07 NOTE — Patient Instructions (Signed)

## 2019-07-14 ENCOUNTER — Other Ambulatory Visit: Payer: Self-pay | Admitting: Sports Medicine

## 2019-07-14 DIAGNOSIS — M722 Plantar fascial fibromatosis: Secondary | ICD-10-CM

## 2019-07-19 ENCOUNTER — Encounter: Payer: 59 | Attending: Physician Assistant | Admitting: Registered"

## 2019-07-19 ENCOUNTER — Other Ambulatory Visit: Payer: Self-pay

## 2019-07-19 DIAGNOSIS — Z713 Dietary counseling and surveillance: Secondary | ICD-10-CM | POA: Diagnosis present

## 2019-07-19 DIAGNOSIS — E669 Obesity, unspecified: Secondary | ICD-10-CM | POA: Diagnosis present

## 2019-07-19 DIAGNOSIS — I10 Essential (primary) hypertension: Secondary | ICD-10-CM | POA: Diagnosis present

## 2019-07-19 DIAGNOSIS — Z6834 Body mass index (BMI) 34.0-34.9, adult: Secondary | ICD-10-CM | POA: Diagnosis not present

## 2019-07-19 NOTE — Progress Notes (Signed)
Medical Nutrition Therapy:  Appt start time: 0802 end time:  0840.  Assessment:  Primary concerns today: Pt referred due to weight management and HTN. Nutrition Follow-Up:  Pt present for appointment alone.   Pt reports things are going ok. Reports doing a lot better emotionally, but not physically. Has went to podiatrist regarding plantar fascitis and received cortisone shot.  Reports chair exercises were helpful. Reports still has pain if doing walking/standing on feet. Pt reports when she uses eliptical or stationary bike at park it does not bother her feet. Reports she wants to join a gym but is concerned about being around people. Is thinking about going to a community center in her area. Pt reports she did water aerobics at aquatic center in the past. Pt does not have [facility] currently because she is on [employer]. She is interested in checking out the [facility].   Reports nutrition is going ok, better than it was before. Reports having yogurt in morning, fruit mid-morning, lunch, seldom an afternoon snack and then dinner. Reports typically no longer snacking in the evening like she was before. Reports she has been occupying herself with other activities in the evening which has helped reduce snacking. Reports if she does have a snack she tries to do fruit or a granola bar.   Pt reports she will be having updated labs at the end of May.   Hobbies: Pt reports she enjoys reading, cross stitching and working puzzles.   Food Allergies/Intolerances: None reported.  GI Concerns: None reported.   Sleep Routine: Goes to bed around 11 PM and up by 7 AM. Reports waking 2-3 times each night. Reports low energy level.   Pertinent Lab Values: 01/06/2019: GFR: 58 (L) Creatinine: 1.02 (H) Potassium: 5.4 (H) Triglycerides: 188 (H) LDL Cholesterol: 103 (H)  Weight Hx: Reports being 118 lb in past. Gained weight since 2018 per pt report.   Preferred Learning Style:    No preference indicated   Learning Readiness:   Ready  MEDICATIONS: Reviewed.    DIETARY INTAKE:  Usual eating pattern includes 3 meals and 1-2 snacks per day.   Common foods: salmon, tuna, spinach, turnip greens, prepares a meat, starch and vegetable. Often does frozen vegetables, not much canned.  Avoided foods: cauliflower, brussels sprouts, quinoa. Eats most foods per pt. Really likes steak, baked potatoes.      Typical Snacks: celery, carrots, apples, granola.   Typical Beverages: 6 oz x 3 cups decaf coffee (small amount of sugar-reports cutting back, sometimes without sugar); 3 x 16 oz bottles water; occasionally half sweet, half unsweet tea.   Location of Meals: usually on couch in front of TV; lunch is at dining room table   Electronics Present at [place]: Yes: TV  24-hr recall:  B ( AM): [place] strawberry banana yogurt, 3 cups decaf coffee with whole milk  Snk ( AM): None reported.  L ( PM): salad (lettuce, spinach, sunflower seeds unsalted, feta cheese, half boiled egg, grilled chicken, carrots), raspberry vinaigrette, water  Snk ( PM): None reported.  D ( PM): roast beef, carrots, 1 boiled potato, water  Snk ( PM): None reported.  Beverages: 3 bottles water; 3 small cups coffee with whole milk   Usual physical activity: walking Minutes/Week: ~20 minutes x every other day. Uses stationary bike and elliptical at park without issue.   Progress Towards Goal(s):  In progress.   Nutritional Diagnosis:  NB-1.1 Food and nutrition-related knowledge deficit As related to no  prior appointments with a registered dietitian .  As evidenced by pt seeking nutrition recommendations for weight loss and overall health.    Intervention:  Nutrition counseling provided. Praised pt's progress with continuing to have 3 meals daily and reducing snacking due to emotions/boredom. Discussed making go to activities easier to do than grabbing a snack when bored. Encouraged working to have  balance at meals (a starch as well as vegetables and protein) and adding some more water. Discussed checking out local [facility] for water aerobics or exercise equipment that does not agitate feet. Discussed hx of elevated potassium. Recommended currently limiting foods highest in potassium and if updated labs are still elevated, discussed limiting to no more than 1 high potassium food serving daily. Pt appeared agreeable to information/goals discussed.  Instructions/Goals:  Make sure to get in three meals per day. Try to have balanced meals like the My Plate example (see handout).   Continue with 3 meals per day  For sodium and saturated fat: limit those with 20% or more daily value on label  Include heart healthy unsaturated fats and limit those high in saturated fat   Limit foods very high in potassium until you receive an updated lab due to last one being elevated   If potassium is elevated after your next lab test, recommend limiting to 1 serving of high potassium foods/2300 mg potassium per day. Will discuss labs at next visit.   Continue with limiting added sugar and sodium   Try to increase water to 4 bottles daily   Goal: Practice Mindful Eating  At meal and snack times, put away electronics (TV, phone, tablet, etc.) and try to eat seated at a table so you can better focus on eating your meal/snack and promote listening to your body's fullness and hunger signals.  If you feel that you are wanting to snack because your are bored or due to emotions and not because you are hungry, try to do a fun activity (read a book, take a walk, talk with a friend, etc.) for at least 20 minutes.   Make list of go to activities: cross stitching, reading, puzzles, walking outdoors.   Make snacks less easy to run to than activities. Put out of sight, behind more beneficial snacks, etc   Make physical activity a part of your week. Try to include at least 30 minutes of physical activity 5 days  each week or at least 150 minutes per week. Regular physical activity promotes overall health-including helping to reduce risk for heart disease and diabetes, promoting mental health, and helping [place] sleep better.    Recommend incorporating more physical activities that don't agitate your foot.   May check into community center or local senior centers.   [facility] offers [place] aerobics. [address] [place], [place] [address], [phone]  -Please have your doctor send over lab results when you get them updated at end of month   Teaching Method Utilized:  Visual Auditory  Barriers to learning/adherence to lifestyle change: None indicated.   Demonstrated degree of understanding via:  Teach Back   Monitoring/Evaluation:  Dietary intake, exercise, and body weight in 1 month(s).

## 2019-07-19 NOTE — Patient Instructions (Addendum)
Instructions/Goals:  Make sure to get in three meals per day. Try to have balanced meals like the My Plate example (see handout).   Continue with 3 meals per day  For sodium and saturated fat: limit those with 20% or more daily value on label  Include heart healthy unsaturated fats and limit those high in saturated fat   Limit foods very high in potassium until you receive an updated lab due to last one being elevated   If potassium is elevated after your next lab test, recommend limiting to 1 serving of high potassium foods/2300 mg potassium per day. Will discuss labs at next visit.   Continue with limiting added sugar and sodium   Try to increase water to 4 bottles daily   Goal: Practice Mindful Eating  At meal and snack times, put away electronics (TV, phone, tablet, etc.) and try to eat seated at a table so you can better focus on eating your meal/snack and promote listening to your body's fullness and hunger signals.  If you feel that you are wanting to snack because your are bored or due to emotions and not because you are hungry, try to do a fun activity (read a book, take a walk, talk with a friend, etc.) for at least 20 minutes.   Make list of go to activities: cross stitching, reading, puzzles, walking outdoors.   Make snacks less easy to run to than activities. Put out of sight, behind more beneficial snacks, etc   Make physical activity a part of your week. Try to include at least 30 minutes of physical activity 5 days each week or at least 150 minutes per week. Regular physical activity promotes overall health-including helping to reduce risk for heart disease and diabetes, promoting mental health, and helping [place] sleep better.    Recommend incorporating more physical activities that don't agitate your foot.   May check into community center or local senior centers.   [facility] offers water aerobics. [address], [place] [address], [phone]  -Please have  your doctor send over lab results when you get them updated at end of month

## 2019-07-28 ENCOUNTER — Ambulatory Visit: Payer: Medicare Other | Admitting: Sports Medicine

## 2019-08-04 ENCOUNTER — Encounter: Payer: Self-pay | Admitting: Sports Medicine

## 2019-08-04 ENCOUNTER — Other Ambulatory Visit: Payer: Self-pay

## 2019-08-04 ENCOUNTER — Ambulatory Visit (INDEPENDENT_AMBULATORY_CARE_PROVIDER_SITE_OTHER): Payer: Managed Care, Other (non HMO) | Admitting: Sports Medicine

## 2019-08-04 DIAGNOSIS — M216X1 Other acquired deformities of right foot: Secondary | ICD-10-CM

## 2019-08-04 DIAGNOSIS — M779 Enthesopathy, unspecified: Secondary | ICD-10-CM

## 2019-08-04 DIAGNOSIS — M79671 Pain in right foot: Secondary | ICD-10-CM

## 2019-08-04 DIAGNOSIS — M722 Plantar fascial fibromatosis: Secondary | ICD-10-CM | POA: Diagnosis not present

## 2019-08-04 MED ORDER — TRIAMCINOLONE ACETONIDE 10 MG/ML IJ SUSP
10.0000 mg | Freq: Once | INTRAMUSCULAR | Status: AC
  Administered 2019-08-04: 10 mg

## 2019-08-04 NOTE — Progress Notes (Signed)
Subjective: Marissa Howard is a 64 y.o. female returns to office for follow up evaluation after Right heel injection for plantar fasciitis, injection #1 administered 4 weeks ago. Patient states that the injection seems to help her pain; pain is now on the lateral side of the heel that started on yesterday. Patient denies any recent changes in medications or new problems since last visit.   Patient Active Problem List   Diagnosis Date Noted  . Age-related osteoporosis without current pathological fracture 04/07/2019  . Elevated serum creatinine 01/06/2018  . Plantar fasciitis, bilateral 09/30/2017  . RUQ abdominal pain 09/03/2017  . Rib pain on right side 09/03/2017  . Adult situational stress disorder 02/16/2017  . Bilateral carotid bruits 12/18/2016  . BMI 31.0-31.9,adult 08/14/2016  . Palpitations 06/15/2011  . Chest pain 05/19/2011  . HTN (hypertension) 03/29/2011  . Elevated LFTs 12/17/2010  . Hyperlipemia 12/17/2010    Current Outpatient Medications on File Prior to Visit  Medication Sig Dispense Refill  . Ascorbic Acid (VITAMIN C PO) Take by mouth.    . Cyanocobalamin (B-12 PO) Take by mouth.    . losartan (COZAAR) 50 MG tablet Take 50 mg by mouth in the morning and at bedtime.    . Multiple Vitamin (MULTIVITAMIN) tablet Take 1 tablet by mouth daily. Reported on 10/01/2015    . Multiple Vitamins-Minerals (ZINC PO) Take by mouth.    Marland Kitchen OVER THE COUNTER MEDICATION OTC Krill Oil taking daily    . rosuvastatin (CRESTOR) 20 MG tablet Take 1 tablet (20 mg total) by mouth daily. 90 tablet 3  . VITAMIN D PO Take by mouth.     No current facility-administered medications on file prior to visit.    Allergies  Allergen Reactions  . Lisinopril Cough  . Tetracyclines & Related Other (See Comments)    Pt reports she gets "shakey"    Objective:   General:  Alert and oriented x 3, in no acute distress  Dermatology: Skin is warm, dry, and supple bilateral. Nails are within normal  limits. There is no lower extremity erythema, no eccymosis, no open lesions present bilateral.   Vascular: Dorsalis Pedis and Posterior Tibial pedal pulses are 2/4 bilateral. + hair growth noted bilateral. Capillary Fill Time is 3 seconds in all digits. No varicosities, No edema bilateral lower extremities.   Neurological: Sensation grossly intact to light touch. Vibratory within normal limits.   Musculoskeletal: There is no tenderness to palpation at the medial calcaneal tubercale but today there is pain at lateral tubercale through the insertion of the plantar fascia on the right foot. No pain with compression to calcaneus or application of tuning fork. There is decreased Ankle joint range of motion bilateral. All other joints range of motion  within normal limits bilateral. Strength 5/5 bilateral.   Assessment and Plan: Problem List Items Addressed This Visit    None    Visit Diagnoses    Plantar fasciitis of right foot    -  Primary   Now lateral pain   Tendinitis       Acquired equinus deformity of right foot       Pain of right heel          -Complete examination performed.  -Previous x-rays reviewed. -Discussed with patient in detail the condition of plantar fasciitis, how this  occurs related to the foot type of the patient and general treatment options. - Patient opted for another injection today; After oral consent and aseptic prep, injected a mixture  containing 1 ml of 1%plain lidocaine, 1 ml 0.5% plain marcaine, 0.5 ml of kenalog 10 and 0.5 ml of dexmethasone phosphate to right heel at area of most pain/trigger point injection at lateral heel. -Continue with night splint and brace that she has at home as instructed -Continue with stretching, icing, good supportive shoes, inserts daily.  -Discussed long term care and reocurrence; will closely monitor; if fails to improve will consider other treatment modalities.  -Patient to return to office as needed or sooner if problems or  questions arise.  Asencion Islam, DPM

## 2019-08-23 ENCOUNTER — Other Ambulatory Visit: Payer: Self-pay

## 2019-08-23 ENCOUNTER — Encounter: Payer: 59 | Attending: Physician Assistant | Admitting: Registered"

## 2019-08-23 ENCOUNTER — Encounter: Payer: Self-pay | Admitting: Registered"

## 2019-08-23 DIAGNOSIS — E669 Obesity, unspecified: Secondary | ICD-10-CM

## 2019-08-23 DIAGNOSIS — I10 Essential (primary) hypertension: Secondary | ICD-10-CM | POA: Insufficient documentation

## 2019-08-23 DIAGNOSIS — Z6834 Body mass index (BMI) 34.0-34.9, adult: Secondary | ICD-10-CM | POA: Diagnosis not present

## 2019-08-23 DIAGNOSIS — Z713 Dietary counseling and surveillance: Secondary | ICD-10-CM | POA: Diagnosis present

## 2019-08-23 NOTE — Patient Instructions (Addendum)
Instructions/Goals:  Make sure to get in three meals per day. Try to have balanced meals like the My Plate example (see handout).   Continue with 3 meals per day -Great job! Keep it up!   Try to include a non-starchy vegetable with lunch and dinner. May add some fruit with breakfast or as snack.   For sodium and saturated fat: limit those with 20% or more daily value on label.  Include heart healthy unsaturated fats and limit those high in saturated fat and include good sources of omega 3 fatty acids: salmon, tuna, rainbow trout, sardines (watch sodium), walnuts, flaxseed, chia seeds.   No longer need to limit potassium as your last labs were within normal range. This is great!  Continue with limiting added sugar and sodium.   Great job including at least 4 bottles water!  Goal: Practice Mindful Eating  At meal and snack times, put away electronics (TV, phone, tablet, etc.) and try to eat seated at a table so you can better focus on eating your meal/snack and promote listening to your body's fullness and hunger signals.  If you feel that you are wanting to snack because your are bored or due to emotions and not because you are hungry, try to do a fun activity (read a book, take a walk, talk with a friend, etc.) for at least 20 minutes.   Great job!  Make physical activity a part of your week. Try to include at least 30 minutes of physical activity 5 days each week or at least 150 minutes per week. Regular physical activity promotes overall health-including helping to reduce risk for heart disease and diabetes, promoting mental health, and helping [place] sleep better.    Great job incorporating more activity! Keep it up! Recommend gradually adding to goal of 30 minutes most days of the week.   Will add you to the call list for Diabetes Prevention Program in the future to see if it might be a good fit for you based on risk assessment.
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
## 2019-10-04 ENCOUNTER — Ambulatory Visit: Payer: Medicare Other | Admitting: Registered"

## 2019-11-22 IMAGING — US US ABDOMEN LIMITED
1 series · 14 of 25 positions shown · non-contrast
Comparison: Body CT 03/24/2007

CLINICAL DATA: Right upper quadrant and epigastric pain.

EXAM:
ULTRASOUND ABDOMEN LIMITED RIGHT UPPER QUADRANT

[Series 1: us abdomen limited · 0.19mm/px · 14 of 47 slices shown]
[im 1/47]
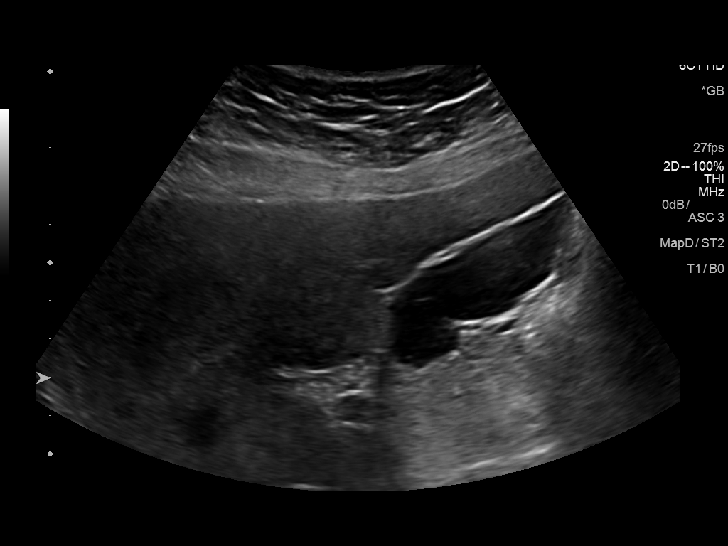
[im 4/47]
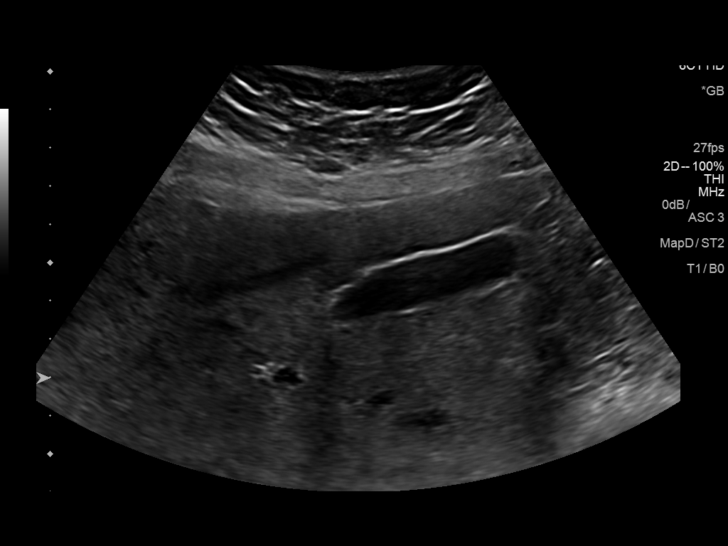
[im 8/47]
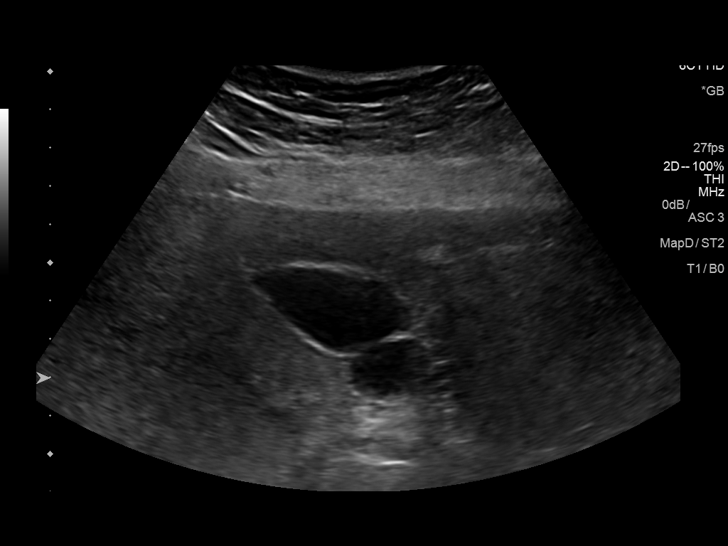
[im 12/47]
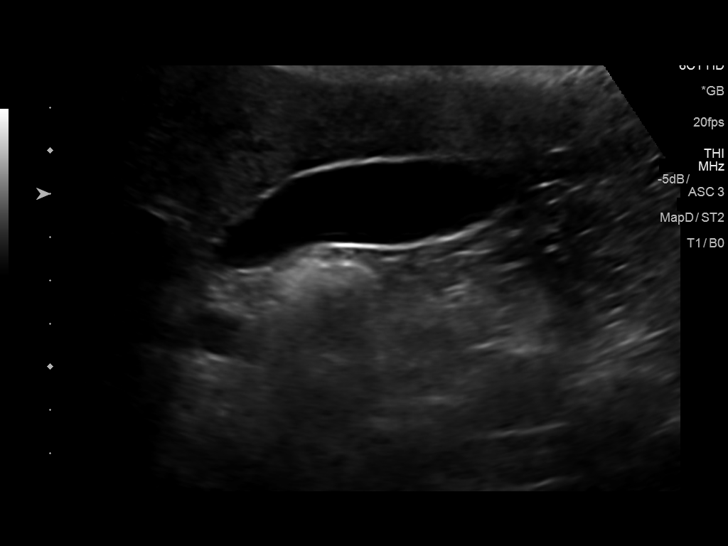
[im 16/47]
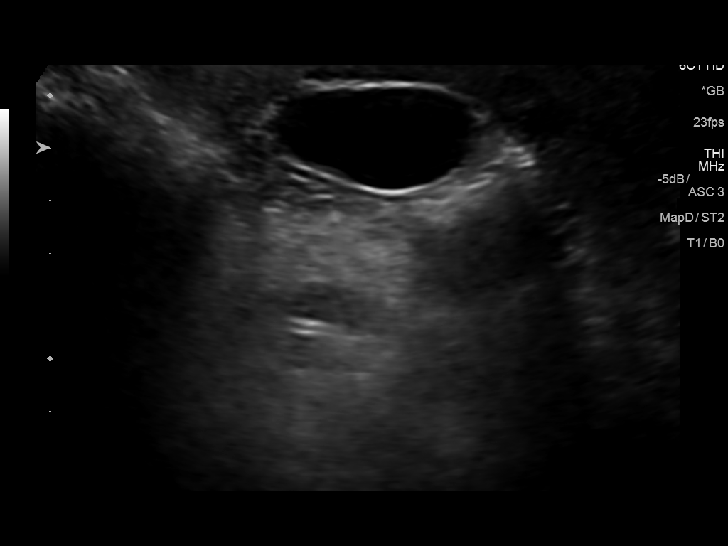
[im 18/47]
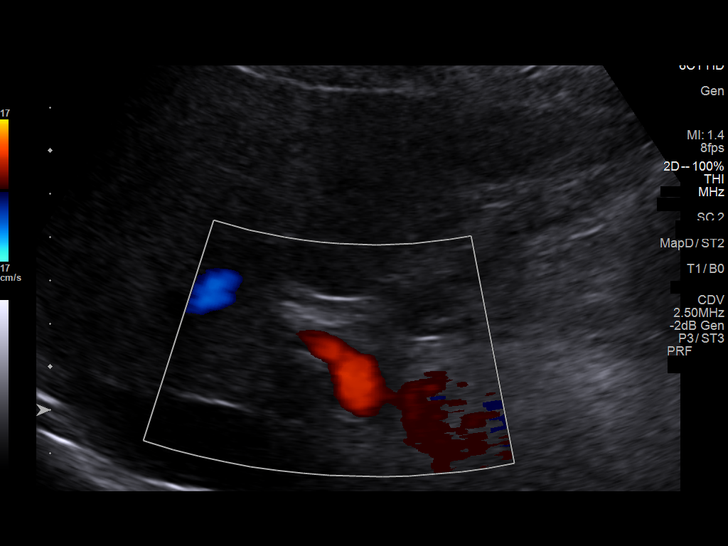
[im 22/47]
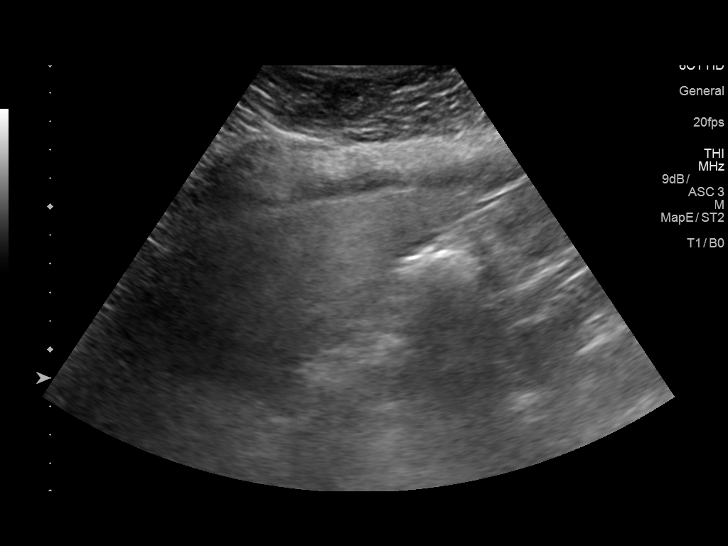
[im 25/47]
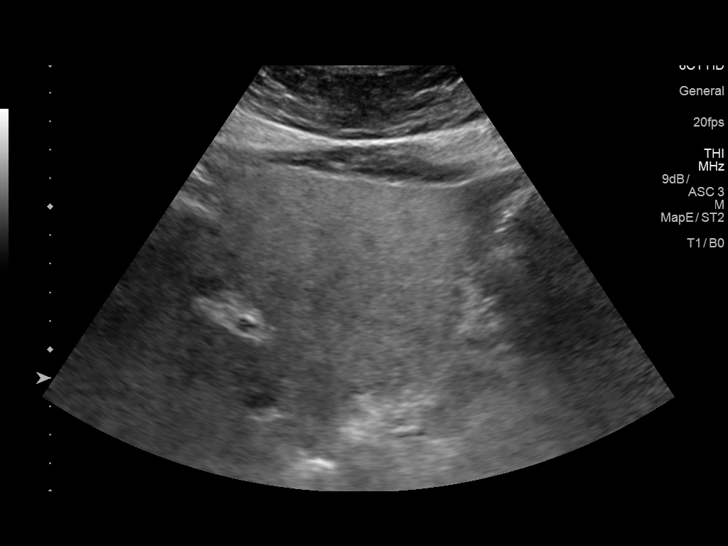
[im 29/47]
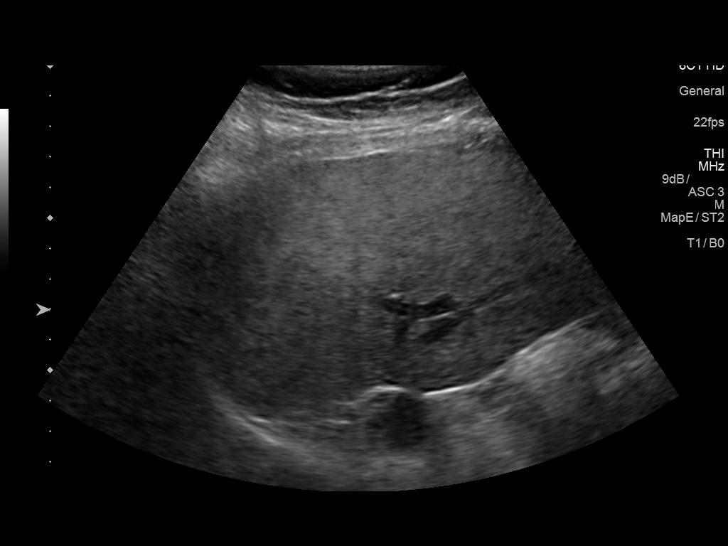
[im 31/47]
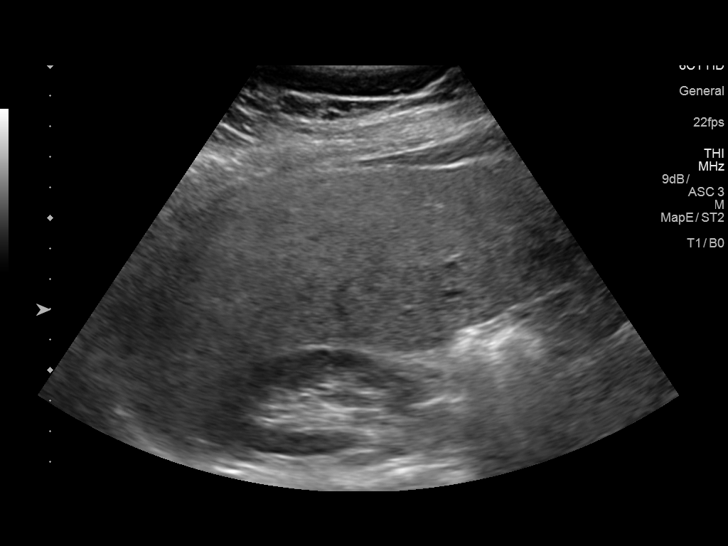
[im 35/47]
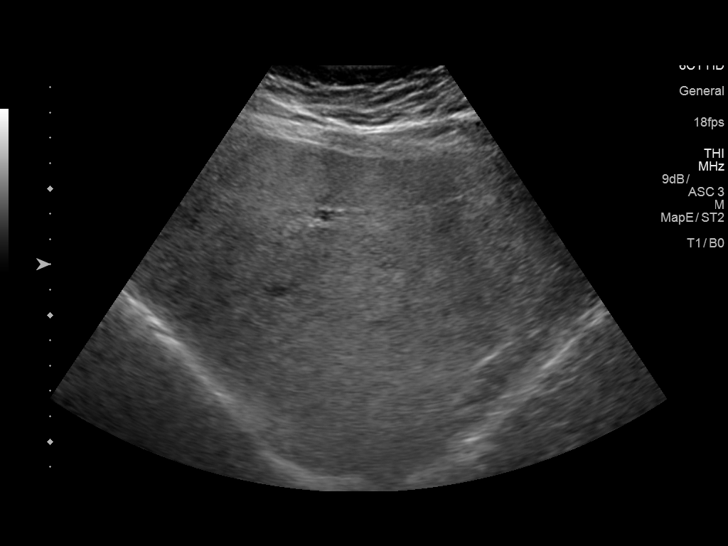
[im 39/47]
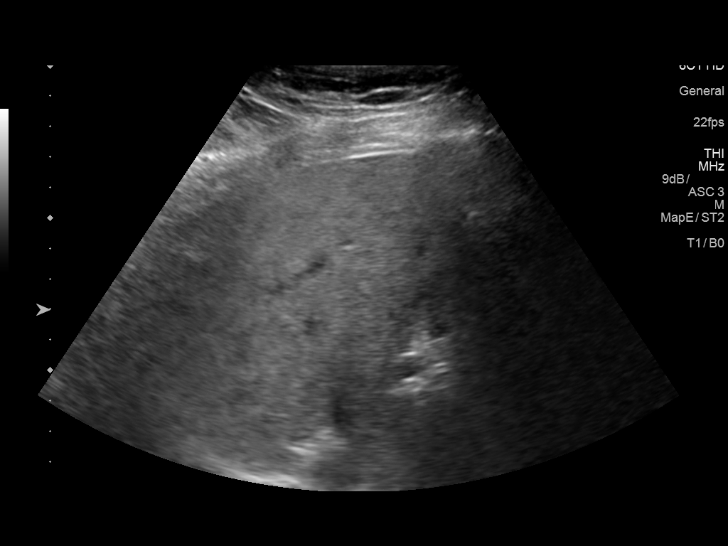
[im 43/47]
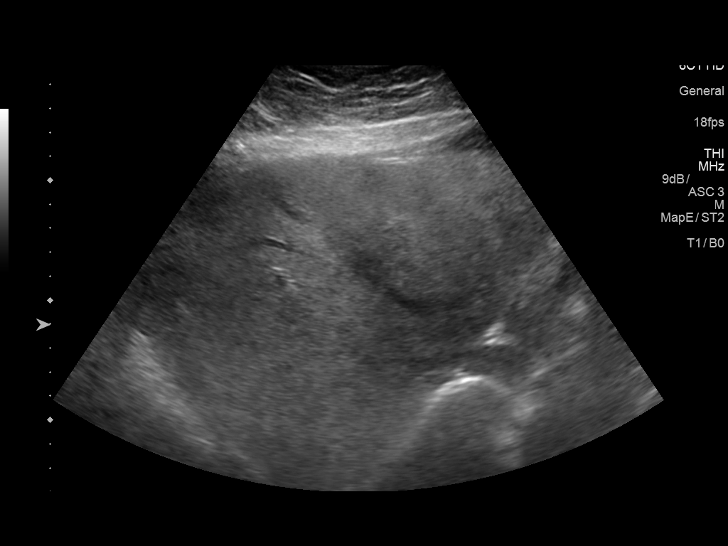
[im 47/47]
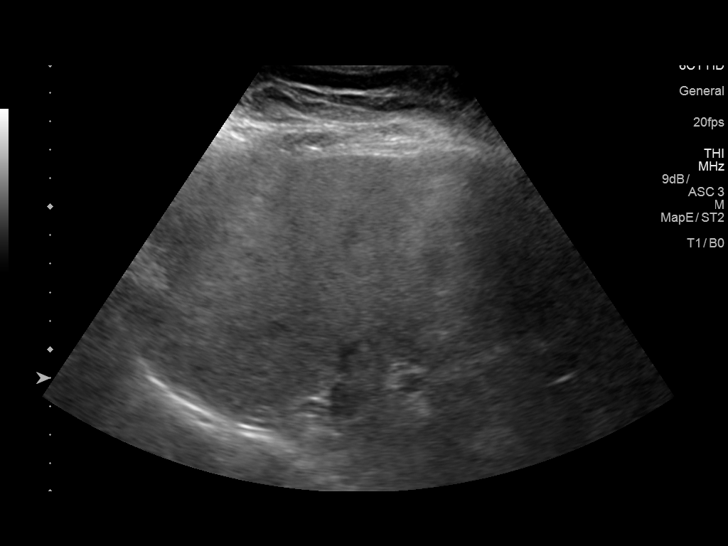

[14 of 25 positions shown; findings below may reference images not displayed]

FINDINGS: Gallbladder:

No gallstones or wall thickening visualized. No sonographic Murphy
sign noted by sonographer.

Common bile duct:

Diameter: 3.5 mm

Liver:

No focal lesion identified. Diffusely increased parenchymal
echogenicity. Portal vein is patent on color Doppler imaging with
normal direction of blood flow towards the liver.
IMPRESSION: No evidence of cholelithiasis.

Diffusely increase heterogeneous echogenicity of the liver, usually
seen with hepatic fibrosis or steatosis.

## 2019-12-01 ENCOUNTER — Other Ambulatory Visit: Payer: Self-pay

## 2019-12-01 ENCOUNTER — Encounter: Payer: Self-pay | Admitting: Sports Medicine

## 2019-12-01 ENCOUNTER — Ambulatory Visit (INDEPENDENT_AMBULATORY_CARE_PROVIDER_SITE_OTHER): Payer: Medicare Other | Admitting: Sports Medicine

## 2019-12-01 DIAGNOSIS — M216X1 Other acquired deformities of right foot: Secondary | ICD-10-CM

## 2019-12-01 DIAGNOSIS — M779 Enthesopathy, unspecified: Secondary | ICD-10-CM

## 2019-12-01 DIAGNOSIS — M722 Plantar fascial fibromatosis: Secondary | ICD-10-CM

## 2019-12-01 DIAGNOSIS — M79671 Pain in right foot: Secondary | ICD-10-CM

## 2019-12-01 MED ORDER — DICLOFENAC SODIUM 75 MG PO TBEC: 75.0000 mg | DELAYED_RELEASE_TABLET | Freq: Two times a day (BID) | ORAL | 0 refills | Status: DC

## 2019-12-01 MED ORDER — TRIAMCINOLONE ACETONIDE 10 MG/ML IJ SUSP
10.0000 mg | Freq: Once | INTRAMUSCULAR | Status: AC
  Administered 2019-12-01: 10 mg

## 2019-12-01 NOTE — Progress Notes (Signed)
Subjective: Marissa Howard is a 64 y.o. female returns to office for follow up evaluation after Right heel injection for plantar fasciitis, injection #2 administered 4 weeks ago. Patient states that the injection seems to help her pain; pain is now back again that has flared up about a week ago on the lateral side of the heel.  Patient denies any recent changes in medications or new problems since last visit.   Patient Active Problem List   Diagnosis Date Noted  . Age-related osteoporosis without current pathological fracture 04/07/2019  . Elevated serum creatinine 01/06/2018  . Plantar fasciitis, bilateral 09/30/2017  . RUQ abdominal pain 09/03/2017  . Rib pain on right side 09/03/2017  . Adult situational stress disorder 02/16/2017  . Bilateral carotid bruits 12/18/2016  . BMI 31.0-31.9,adult 08/14/2016  . Palpitations 06/15/2011  . Chest pain 05/19/2011  . HTN (hypertension) 03/29/2011  . Elevated LFTs 12/17/2010  . Hyperlipemia 12/17/2010    Current Outpatient Medications on File Prior to Visit  Medication Sig Dispense Refill  . Ascorbic Acid (VITAMIN C PO) Take by mouth.    . Cyanocobalamin (B-12 PO) Take by mouth.    . losartan (COZAAR) 50 MG tablet Take 50 mg by mouth in the morning and at bedtime.    . Multiple Vitamin (MULTIVITAMIN) tablet Take 1 tablet by mouth daily. Reported on 10/01/2015    . Multiple Vitamins-Minerals (ZINC PO) Take by mouth.    Marland Kitchen OVER THE COUNTER MEDICATION OTC Krill Oil taking daily    . rosuvastatin (CRESTOR) 20 MG tablet Take 1 tablet (20 mg total) by mouth daily. 90 tablet 3  . VITAMIN D PO Take by mouth.     No current facility-administered medications on file prior to visit.    Allergies  Allergen Reactions  . Lisinopril Cough  . Tetracyclines & Related Other (See Comments)    Pt reports she gets "shakey"    Objective:   General:  Alert and oriented x 3, in no acute distress  Dermatology: Skin is warm, dry, and supple bilateral.  Nails are within normal limits. There is no lower extremity erythema, no eccymosis, no open lesions present bilateral.   Vascular: Dorsalis Pedis and Posterior Tibial pedal pulses are 2/4 bilateral. + hair growth noted bilateral. Capillary Fill Time is 3 seconds in all digits. No varicosities, No edema bilateral lower extremities.   Neurological: Sensation grossly intact to light touch. Vibratory within normal limits.   Musculoskeletal: There is no tenderness to palpation at the medial calcaneal tubercale but today there is pain at lateral tubercale through the insertion of the plantar fascia on the right foot like last visit. No pain with compression to calcaneus or application of tuning fork. There is decreased Ankle joint range of motion bilateral. All other joints range of motion  within normal limits bilateral. Strength 5/5 bilateral.   Assessment and Plan: Problem List Items Addressed This Visit    None    Visit Diagnoses    Plantar fasciitis of right foot    -  Primary   Tendinitis       Acquired equinus deformity of right foot       Pain of right heel          -Complete examination performed.  -Previous x-rays reviewed. -Discussed with patient in detail the condition of plantar fasciitis, how this  occurs related to the foot type of the patient and general treatment options. - Patient opted for another injection today; After oral consent and  aseptic prep, injected a mixture containing 1 ml of 1%plain lidocaine, 1 ml 0.5% plain marcaine, 0.5 ml of kenalog 10 and 0.5 ml of dexmethasone phosphate to right heel at area of most pain/trigger point injection at lateral heel.  This is injection #3 to the heel. -Prescribed diclofenac to take as instructed -Continue with night splint and brace that she has at home as previously instructed -Continue with stretching, icing, good supportive shoes, inserts daily.  -Discussed long term care and reocurrence; will closely monitor; if fails to  improve will consider other treatment modalities.  Advised patient if pain is no better after a few weeks to call office for [place] to send a prescription for her to go to physical therapy. -Patient to return to office as needed or sooner if problems or questions arise.  Asencion Islam, DPM

## 2020-02-26 LAB — EXTERNAL GENERIC LAB PROCEDURE: COLOGUARD: POSITIVE — AB

## 2020-02-26 LAB — COLOGUARD: COLOGUARD: POSITIVE — AB

## 2020-02-29 ENCOUNTER — Encounter: Payer: Self-pay | Admitting: Gastroenterology

## 2020-04-12 ENCOUNTER — Ambulatory Visit (AMBULATORY_SURGERY_CENTER): Payer: Self-pay

## 2020-04-12 ENCOUNTER — Other Ambulatory Visit: Payer: Self-pay

## 2020-04-12 VITALS — Ht 62.0 in | Wt 189.0 lb

## 2020-04-12 DIAGNOSIS — Z1211 Encounter for screening for malignant neoplasm of colon: Secondary | ICD-10-CM

## 2020-04-12 NOTE — Progress Notes (Signed)
No allergies to soy or egg Pt is not on blood thinners or diet pills Denies issues with sedation/intubation Denies atrial flutter/fib Denies constipation   Emmi instructions given to pt  Pt is aware of Covid safety and care partner requirements.  

## 2020-04-15 ENCOUNTER — Telehealth: Payer: Self-pay | Admitting: Gastroenterology

## 2020-04-15 DIAGNOSIS — Z1211 Encounter for screening for malignant neoplasm of colon: Secondary | ICD-10-CM

## 2020-04-15 MED ORDER — PLENVU 140 G PO SOLR
1.0000 | Freq: Once | ORAL | 0 refills | Status: AC
Start: 2020-04-15 — End: 2020-04-15

## 2020-04-15 NOTE — Telephone Encounter (Signed)
Plenvu RX sent to pt's pharmacy.

## 2020-04-19 ENCOUNTER — Ambulatory Visit (AMBULATORY_SURGERY_CENTER): Payer: 59 | Admitting: Gastroenterology

## 2020-04-19 ENCOUNTER — Encounter: Payer: Self-pay | Admitting: Gastroenterology

## 2020-04-19 ENCOUNTER — Other Ambulatory Visit: Payer: Self-pay

## 2020-04-19 VITALS — BP 132/70 | HR 67 | Temp 97.5°F | Resp 17 | Ht 62.0 in | Wt 189.0 lb

## 2020-04-19 DIAGNOSIS — Z1211 Encounter for screening for malignant neoplasm of colon: Secondary | ICD-10-CM | POA: Diagnosis present

## 2020-04-19 MED ORDER — SODIUM CHLORIDE 0.9 % IV SOLN
500.0000 mL | Freq: Once | INTRAVENOUS | Status: DC
Start: 2020-04-19 — End: 2020-04-19

## 2020-04-19 NOTE — Progress Notes (Signed)
Report to PACU, RN, vss, BBS= Clear.  

## 2020-04-19 NOTE — Progress Notes (Signed)
Medical history reviewed with no changes noted since PV. VS assessed by C.W 

## 2020-04-19 NOTE — Op Note (Addendum)
[facility] Patient Name: Marissa Howard Procedure Date: 04/19/2020 8:13 AM MRN: [id number] Endoscopist: Sherilyn Cooter L. Myrtie Neither , MD Age: 65 Referring MD:  Date of Birth: March 19, 1955 Gender: Female Account #: [id number] Procedure:                Colonoscopy Indications:              Screening for colorectal malignant neoplasm, This                            is the patient's first colonoscopy Medicines:                Monitored Anesthesia Care Procedure:                Pre-Anesthesia Assessment:                           - Prior to the procedure, a History and Physical                            was performed, and patient medications and                            allergies were reviewed. The patient's tolerance of                            previous anesthesia was also reviewed. The risks                            and benefits of the procedure and the sedation                            options and risks were discussed with the patient.                            All questions were answered, and informed consent                            was obtained. Prior Anticoagulants: The patient has                            taken no previous anticoagulant or antiplatelet                            agents. ASA Grade Assessment: II - A patient with                            mild systemic disease. After reviewing the risks                            and benefits, the patient was deemed in                            satisfactory condition to undergo the procedure.  After obtaining informed consent, the colonoscope                            was passed under direct vision. Throughout the                            procedure, the patient's blood pressure, pulse, and                            oxygen saturations were monitored continuously. The                            Colonoscope was introduced through the anus and                            advanced to the the cecum,  identified by                            appendiceal orifice and ileocecal valve. The                            colonoscopy was performed without difficulty. The                            patient tolerated the procedure well. The quality                            of the bowel preparation was excellent. The                            ileocecal valve, appendiceal orifice, and rectum                            were photographed. Scope In: Scope Out: Findings:                 The perianal and digital rectal examinations were                            normal.                           The entire examined colon appeared normal on direct                            and retroflexion views. Complications:            No immediate complications. Estimated Blood Loss:     Estimated blood loss: none. Impression:               - The entire examined colon is normal on direct and                            retroflexion views.                           - No  specimens collected. Recommendation:           - Patient has a contact number available for                            emergencies. The signs and symptoms of potential                            delayed complications were discussed with the                            patient. Return to normal activities tomorrow.                            Written discharge instructions were provided to the                            patient.                           - Resume previous diet.                           - Continue present medications.                           - Repeat colonoscopy in 10 years for screening                            purposes. Shreshta Medley L. Myrtie Neither, MD 04/19/2020 8:18:41 AM This report has been signed electronically.

## 2020-04-19 NOTE — Patient Instructions (Signed)
YOU HAD AN ENDOSCOPIC PROCEDURE TODAY AT THE [facility]:   Refer to the procedure report that was given to you for any specific questions about what was found during the examination.  If the procedure report does not answer your questions, please call your gastroenterologist to clarify.  If you requested that your care partner not be given the details of your procedure findings, then the procedure report has been included in a sealed envelope for you to review at your convenience later.  YOU SHOULD EXPECT: Some feelings of bloating in the abdomen. Passage of more gas than usual.  Walking can help get rid of the air that was put into your GI tract during the procedure and reduce the bloating. If you had a lower endoscopy (such as a colonoscopy or flexible sigmoidoscopy) you may notice spotting of blood in your stool or on the toilet paper. If you underwent a bowel prep for your procedure, you may not have a normal bowel movement for a few days.  Please Note:  You might notice some irritation and congestion in your nose or some drainage.  This is from the oxygen used during your procedure.  There is no need for concern and it should clear up in a day or so.  SYMPTOMS TO REPORT IMMEDIATELY:   Following lower endoscopy (colonoscopy or flexible sigmoidoscopy):  Excessive amounts of blood in the stool  Significant tenderness or worsening of abdominal pains  Swelling of the abdomen that is new, acute  Fever of 100F or higher  For urgent or emergent issues, a gastroenterologist can be reached at any hour by calling [phone]. Do not use MyChart messaging for urgent concerns.    DIET:  We do recommend a small meal at first, but then you may proceed to your regular diet.  Drink plenty of fluids but you should avoid alcoholic beverages for 24 hours.  ACTIVITY:  You should plan to take it easy for the rest of today and you should NOT DRIVE or use heavy machinery until tomorrow (because  of the sedation medicines used during the test).    FOLLOW UP: Our staff will call the number listed on your records 48-72 hours following your procedure to check on you and address any questions or concerns that you may have regarding the information given to you following your procedure. If we do not reach you, we will leave a message.  We will attempt to reach you two times.  During this call, we will ask if you have developed any symptoms of COVID 19. If you develop any symptoms (ie: fever, flu-like symptoms, shortness of breath, cough etc.) before then, please call [phone].  If you test positive for Covid 19 in the 2 weeks post procedure, please call and report this information to us.    If any biopsies were taken you will be contacted by phone or by letter within the next 1-3 weeks.  Please call us at [phone] if you have not heard about the biopsies in 3 weeks.    SIGNATURES/CONFIDENTIALITY: You and/or your care partner have signed paperwork which will be entered into your electronic medical record.  These signatures attest to the fact that that the information above on your After Visit Summary has been reviewed and is understood.  Full responsibility of the confidentiality of this discharge information lies with you and/or your care-partner. 

## 2020-04-23 ENCOUNTER — Telehealth: Payer: Self-pay | Admitting: *Deleted

## 2020-04-23 NOTE — Telephone Encounter (Signed)
1. Have you developed a fever since your procedure? no  2.   Have you had an respiratory symptoms (SOB or cough) since your procedure? no  3.   Have you tested positive for COVID 19 since your procedure no  4.   Have you had any family members/close contacts diagnosed with the COVID 19 since your procedure?  no   If yes to any of these questions please route to Laverna Peace, RN and Karlton Lemon, RN Follow up Call-  Call back number 04/19/2020  Post procedure Call Back phone  # [phone]  Permission to leave phone message Yes  Some recent data might be hidden     Patient questions:  Do you have a fever, pain , or abdominal swelling? No. Pain Score  0 *  Have you tolerated food without any problems? Yes.    Have you been able to return to your normal activities? Yes.    Do you have any questions about your discharge instructions: Diet   No. Medications  No. Follow up visit  No.  Do you have questions or concerns about your Care? No.  Actions: * If pain score is 4 or above: No action needed, pain <4.

## 2020-06-15 IMAGING — MG DIGITAL SCREENING BILATERAL MAMMOGRAM WITH TOMO AND CAD
8 series · 8 of 24 positions shown · non-contrast
Comparison: Previous exam(s).

CLINICAL DATA: Screening.

EXAM:
DIGITAL SCREENING BILATERAL MAMMOGRAM WITH TOMO AND CAD

[R CC synth-2D]
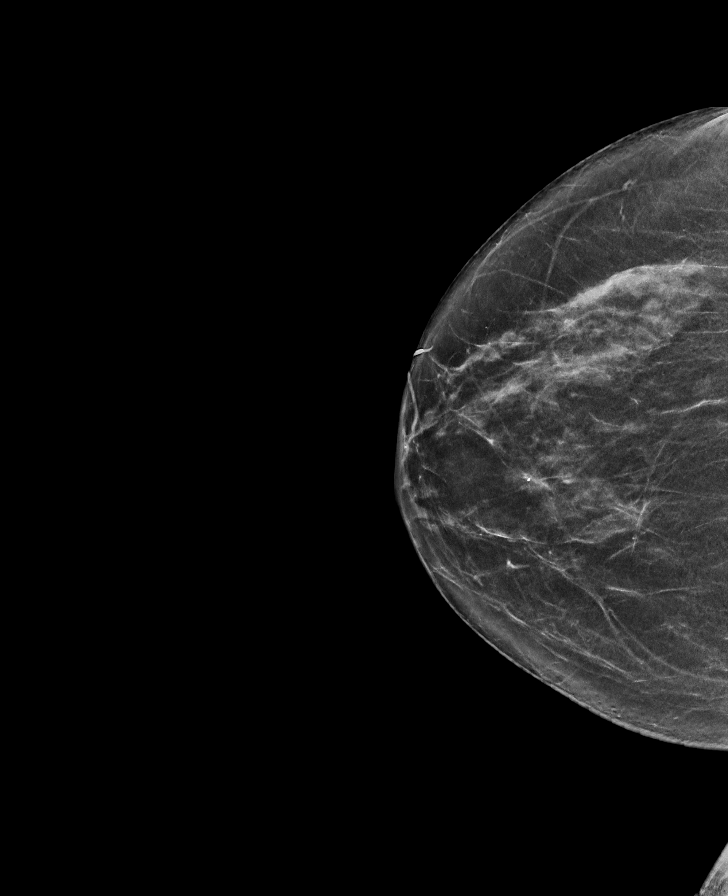

[L CC synth-2D]
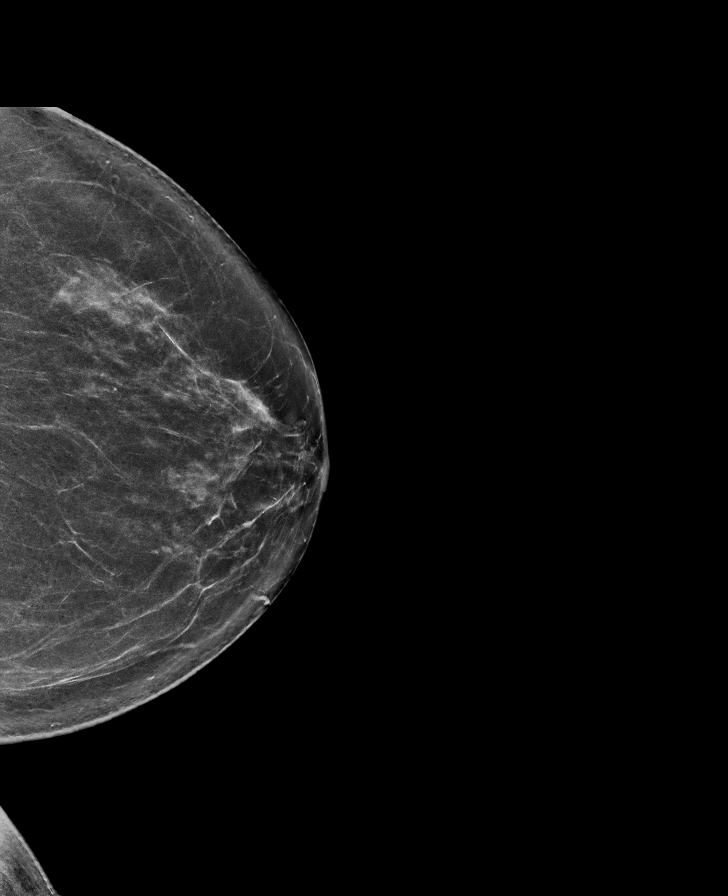

[R MLO synth-2D]
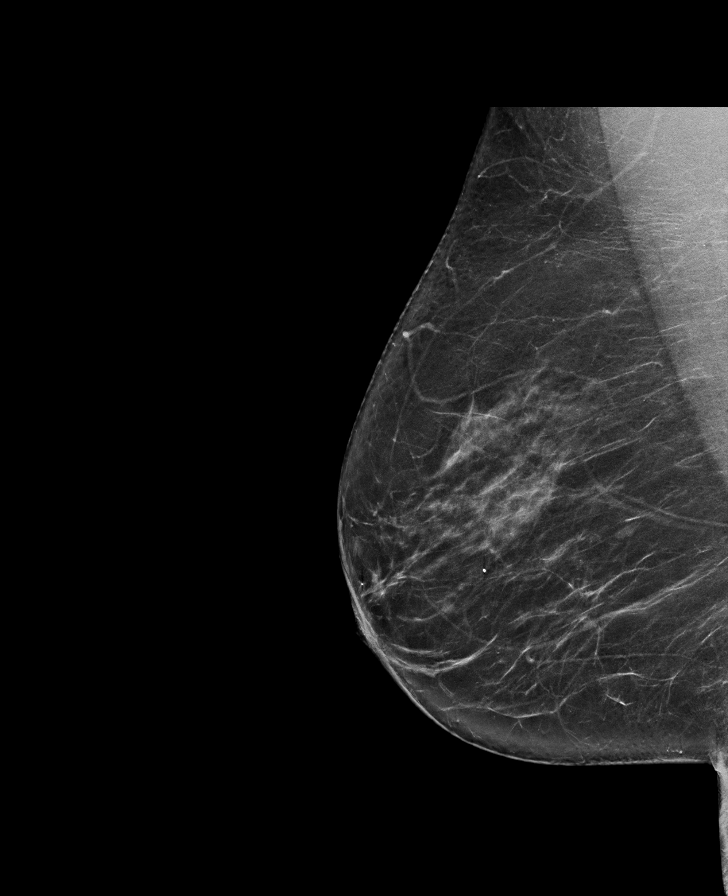

[L MLO synth-2D]
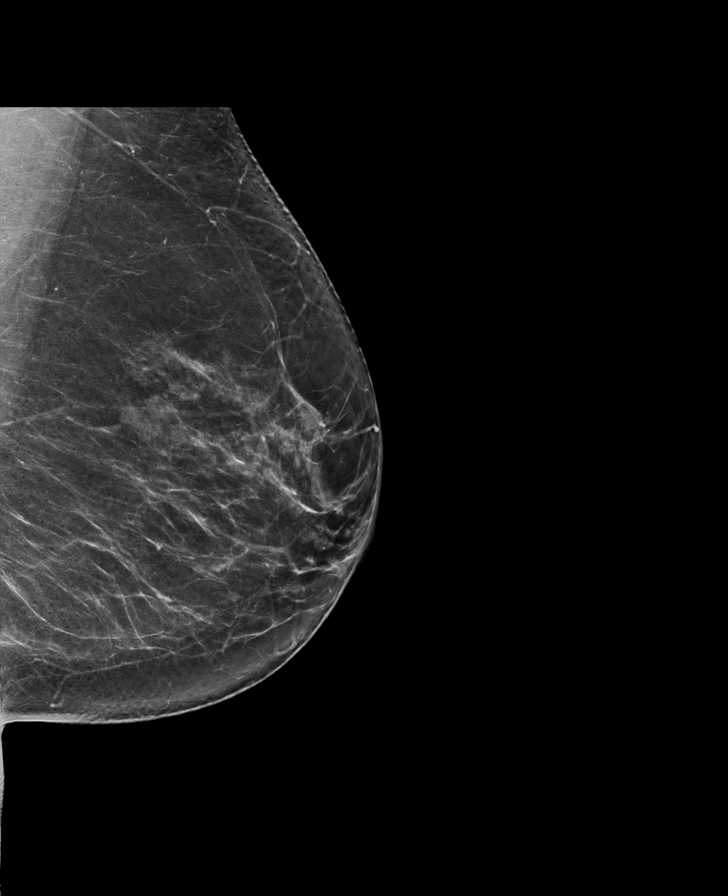

[L CC tomo · tomo slice 39/76.0]
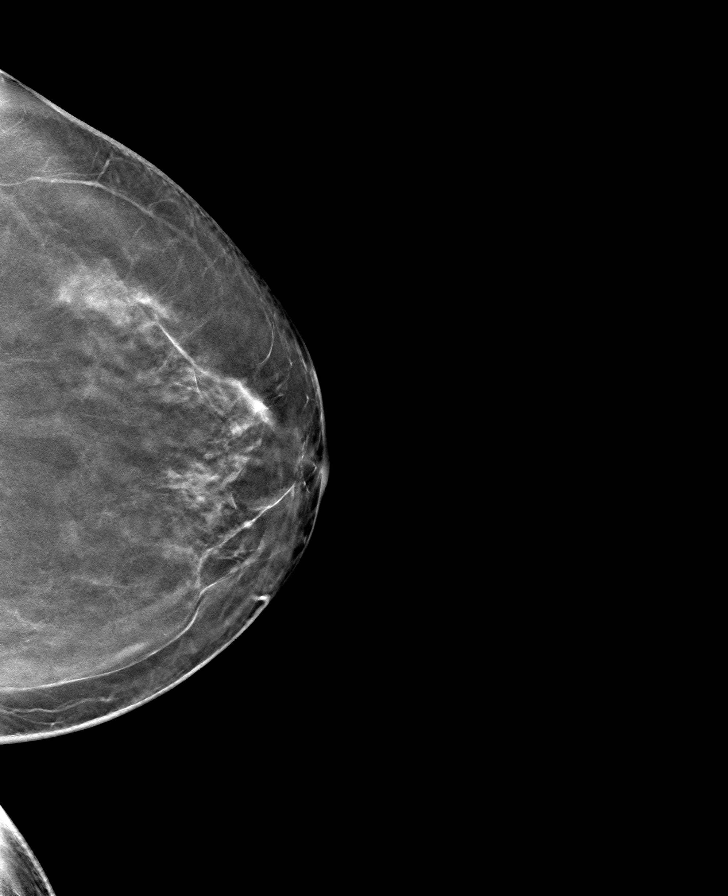

[R MLO tomo · tomo slice 41/80.0]
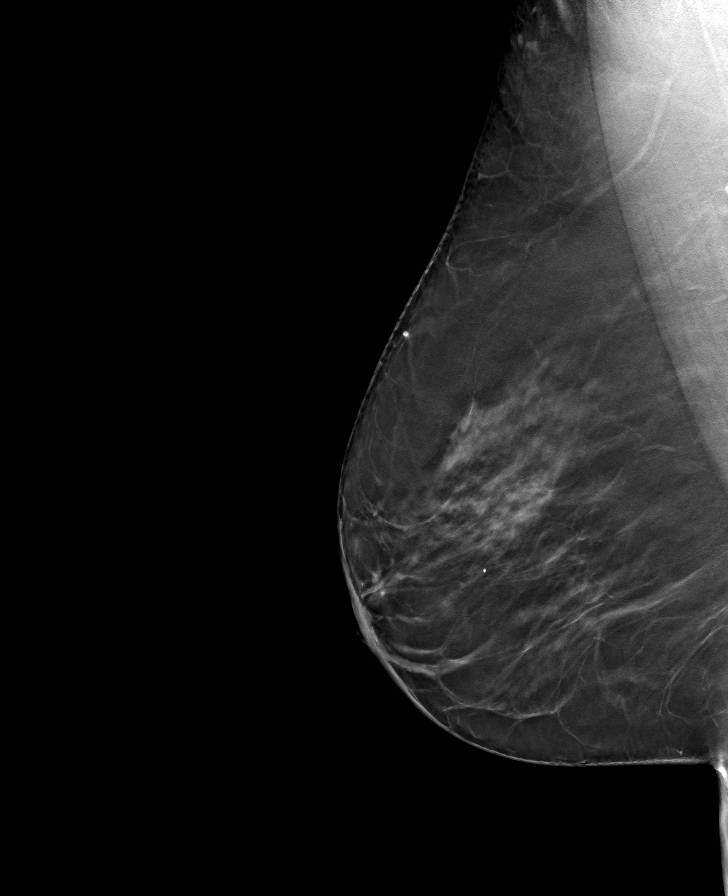

[R CC tomo · tomo slice 35/70.0]
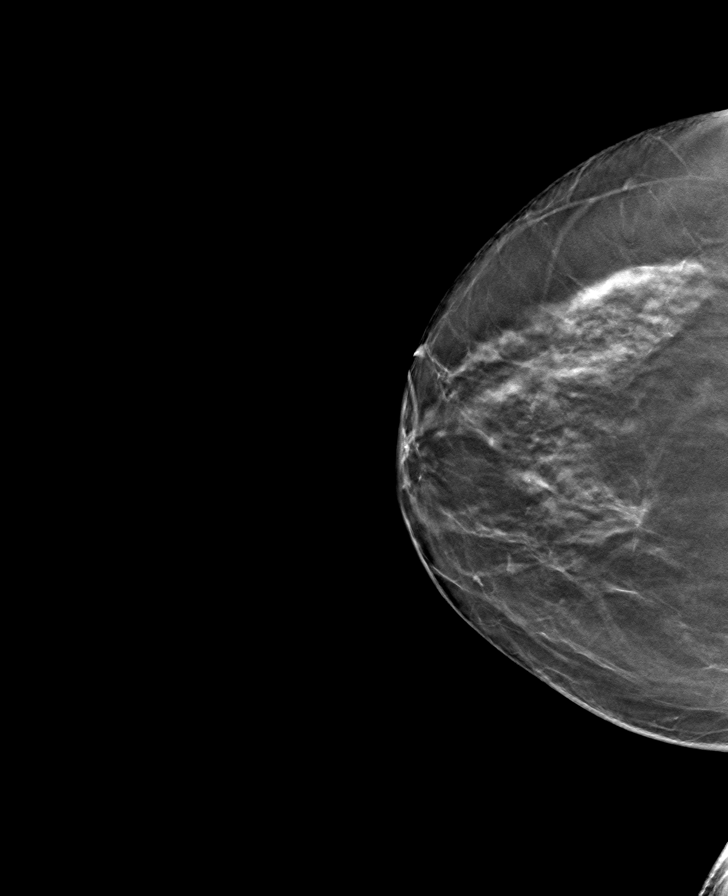

[L MLO tomo · tomo slice 39/77.0]
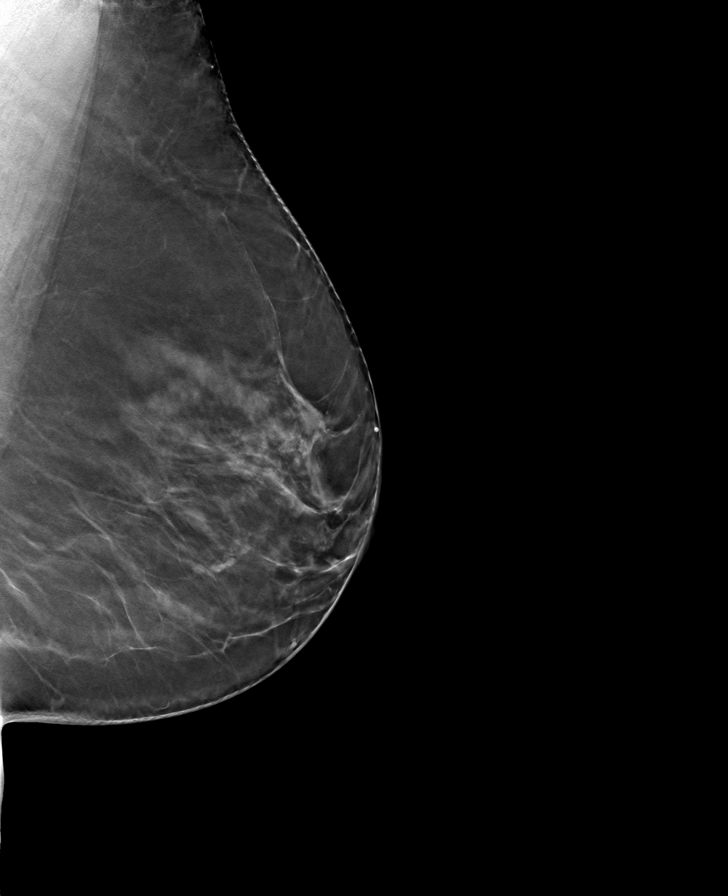

[8 of 24 positions shown; findings below may reference images not displayed]

ACR Breast Density Category b: There are scattered areas of
fibroglandular density.
FINDINGS: There are no findings suspicious for malignancy. Images were
processed with CAD.
IMPRESSION: No mammographic evidence of malignancy. A result letter of this
screening mammogram will be mailed directly to the patient.

RECOMMENDATION:
Screening mammogram in one year. (Code:[id number])

BI-RADS CATEGORY  1: Negative.

## 2021-08-05 NOTE — Progress Notes (Unsigned)
No chief complaint on file.  History of Present Illness: 66 yo female with history of HTN, HLD and tobacco abuse who is here today for cardiac follow up.She was seen in 2013 for evaluation of chest pain and palpitations. Stress myoview in 2013 with no ischemia. Echo in 2013 with normal LV systolic function and no valve disease. I saw her back in the office in August 2018 and she was doing well. She quit smoking in 2018.   She is here today for follow up. The patient denies any chest pain, dyspnea, palpitations, lower extremity edema, orthopnea, PND, dizziness, near syncope or syncope.   Primary Care Physician: Bernerd Limbo, MD  Past Medical History:  Diagnosis Date   Hyperlipidemia    Hypertension    Palpitations 12/17/2010   Tobacco abuse     Past Surgical History:  Procedure Laterality Date   ABDOMINAL HYSTERECTOMY     partial   BREAST EXCISIONAL BIOPSY Right over 10 years   BREAST LUMPECTOMY      Current Outpatient Medications  Medication Sig Dispense Refill   Ascorbic Acid (VITAMIN C PO) Take by mouth.     Cyanocobalamin (B-12 PO) Take by mouth.     diclofenac (VOLTAREN) 75 MG EC tablet Take 1 tablet (75 mg total) by mouth 2 (two) times daily. (Patient not taking: Reported on 04/19/2020) 30 tablet 0   hydrochlorothiazide (MICROZIDE) 12.5 MG capsule Take by mouth.     losartan (COZAAR) 50 MG tablet Take 50 mg by mouth in the morning and at bedtime.     Multiple Vitamin (MULTIVITAMIN) tablet Take 1 tablet by mouth daily. Reported on 10/01/2015     Multiple Vitamins-Minerals (ZINC PO) Take by mouth.     OVER THE COUNTER MEDICATION OTC Krill Oil taking daily     rosuvastatin (CRESTOR) 20 MG tablet Take 1 tablet (20 mg total) by mouth daily. 90 tablet 3   VITAMIN D PO Take by mouth.     No current facility-administered medications for this visit.    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   Lives with her husband.   Her sister, Gregary Signs "Theora Master, lives locally.   Social Determinants of Health   Financial Resource Strain: Not on file  Food Insecurity: Not on file  Transportation Needs: Not on file  Physical Activity: Not on file  Stress: Not on file  Social Connections: Not on file  Intimate Partner Violence: Not on file    Family History  Problem Relation Age of Onset   Heart murmur Mother    Stroke Mother    Dementia Mother    Stroke Father    Heart attack Paternal Grandfather    Heart attack Maternal Grandmother    Stroke Paternal Grandmother    Colon cancer Neg Hx    Colon polyps Neg Hx    Esophageal cancer Neg Hx    Rectal cancer Neg Hx    Stomach cancer Neg Hx     Review of Systems:  As stated in the HPI and otherwise negative.   There were no vitals taken for this visit.  Physical Examination: General: Well developed, well nourished, NAD  HEENT: OP clear, mucus membranes moist  SKIN: warm, dry. No rashes. Neuro: No focal deficits  Musculoskeletal:  Muscle strength 5/5 all ext  Psychiatric: Mood and affect normal  Neck: No JVD, no carotid bruits, no thyromegaly, no lymphadenopathy.  Lungs:Clear bilaterally, no wheezes, rhonci, crackles Cardiovascular: Regular rate and rhythm. No murmurs, gallops or rubs. Abdomen:Soft. Bowel sounds present. Non-tender.  Extremities: No lower extremity edema. Pulses are 2 + in the bilateral DP/PT.  EKG:  EKG is *** ordered today. The ekg ordered today demonstrates   Recent Labs: No results found for requested labs within last 8760 hours.   Lipid Panel    Component Value Date/Time   CHOL 197 08/10/2017 0915   TRIG 251 (H) 08/10/2017 0915   HDL 44 08/10/2017 0915   CHOLHDL 4.5 (H) 08/10/2017 0915   CHOLHDL 4.9 10/01/2015 1147   VLDL 44 (H) 10/01/2015 1147   LDLCALC 103 (H) 08/10/2017 0915     Wt Readings from Last 3 Encounters:  04/19/20 189 lb (85.7 kg)  04/12/20 189 lb (85.7 kg)  08/23/19 187 lb 12.8 oz (85.2 kg)     Other studies Reviewed: Additional studies/ records that were reviewed today include: . Review of the above records demonstrates:   Assessment and Plan:   1. HTN: BP is controlled. No changes.   2. 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   3. Palpitations: ***   Labs/ tests ordered today include:   No orders of the defined types were placed in this encounter.   Disposition:   FU with me in ***  months   Signed, Lauree Chandler, MD 08/05/2021 1:22 PM    [facility] [place], [place], [place]  [address] Phone: [phone]; Fax: [phone]

## 2021-08-06 ENCOUNTER — Ambulatory Visit (INDEPENDENT_AMBULATORY_CARE_PROVIDER_SITE_OTHER): Payer: 59 | Admitting: Cardiovascular Disease

## 2021-08-06 ENCOUNTER — Encounter: Payer: Self-pay | Admitting: Cardiovascular Disease

## 2021-08-06 VITALS — BP 142/82 | HR 65 | Ht 62.0 in | Wt 197.2 lb

## 2021-08-06 DIAGNOSIS — I10 Essential (primary) hypertension: Secondary | ICD-10-CM

## 2021-08-06 MED ORDER — HYDROCHLOROTHIAZIDE 25 MG PO TABS: 25.0000 mg | ORAL_TABLET | Freq: Every day | ORAL | 3 refills | Status: AC

## 2021-08-06 NOTE — Patient Instructions (Signed)
Medication Instructions:  Your physician has recommended you make the following change in your medication:  1.) increase hctz to 25 mg  - one tablet daily  *If you need a refill on your cardiac medications before your next appointment, please call your pharmacy*   Lab Work: none If you have labs (blood work) drawn today and your tests are completely normal, you will receive your results only by: MyChart Message (if you have MyChart) OR A paper copy in the mail If you have any lab test that is abnormal or we need to change your treatment, we will call you to review the results.   Testing/Procedures: none   Follow-Up: At [facility], you and your health needs are our priority.  As part of our continuing mission to provide you with exceptional heart care, we have created designated Provider Care Teams.  These Care Teams include your primary Cardiologist (physician) and Advanced Practice Providers (APPs -  Physician Assistants and Nurse Practitioners) who all work together to provide you with the care you need, when you need it.   Your next appointment:   2 year(s)  The format for your next appointment:   In Person  Provider:   Verne Carrow, MD     Important Information About Sugar

## 2023-12-17 ENCOUNTER — Ambulatory Visit (INDEPENDENT_AMBULATORY_CARE_PROVIDER_SITE_OTHER): Admitting: Podiatry

## 2023-12-17 ENCOUNTER — Ambulatory Visit (INDEPENDENT_AMBULATORY_CARE_PROVIDER_SITE_OTHER)

## 2023-12-17 ENCOUNTER — Encounter: Payer: Self-pay | Admitting: Podiatry

## 2023-12-17 DIAGNOSIS — M778 Other enthesopathies, not elsewhere classified: Secondary | ICD-10-CM

## 2023-12-17 DIAGNOSIS — R600 Localized edema: Secondary | ICD-10-CM | POA: Diagnosis not present

## 2023-12-17 DIAGNOSIS — S99921D Unspecified injury of right foot, subsequent encounter: Secondary | ICD-10-CM | POA: Diagnosis not present

## 2023-12-17 DIAGNOSIS — Z9181 History of falling: Secondary | ICD-10-CM | POA: Diagnosis not present

## 2023-12-17 DIAGNOSIS — R262 Difficulty in walking, not elsewhere classified: Secondary | ICD-10-CM

## 2023-12-17 NOTE — Progress Notes (Signed)
 Chief Complaint  Patient presents with   Foot Injury    Right medial midfoot injury on 11/18/23, missed bottom step and heard a crack. Still swollen a little, boot is in the car.  Not diabetic and no anti coag.    HPI: 68 y.o. female presents today with concern of continued pain to the right midfoot.  On 11/18/2023 she was stepping off of her deck and missed the bottom step.  States she fell down and heard a crack around the midfoot.  She also had a cut to the medial aspect of the right great toe joint.  She has had pain around the right dorsal medial midfoot ever since the injury.  She was seen by urgent care at [facility] in [place] and states that x-rays were negative for fracture.  She was placed in a boot/brace.  What she describes did not have any compression.  She states that she was told to wear this only for 1 week.  She notes that she iced and elevated for short period after the injury.  She feels like something is broken.  Past Medical History:  Diagnosis Date   Hyperlipidemia    Hypertension    Palpitations 12/17/2010   Tobacco abuse    Past Surgical History:  Procedure Laterality Date   ABDOMINAL HYSTERECTOMY     partial   BREAST EXCISIONAL BIOPSY Right over 10 years   BREAST LUMPECTOMY     Allergies  Allergen Reactions   Lisinopril  Cough   Tetracyclines & Related Other (See Comments)    Pt reports she gets shakey   Review of Systems  Musculoskeletal:  Positive for joint pain.     Physical Exam: Palpable pedal pulses.  There is localized edema to the dorsal and medial aspect of the right foot near the first tarsal-metatarsal joint.  There is pain on palpation of this area.  Resisted dorsiflexion and plantarflexion of the foot reproduces pain in this area.  Resisted inversion of the foot also produces pain in this area.  There is no pain on palpation of the posterior tibial tendon from the medial malleolus to its insertion.  No peroneal tendon pain.  No ankle pain with  range of motion.  No ecchymosis is appreciated.  The previous cut to the right great toe appears healed at this time.  Epicritic sensation intact.  Antalgic gait noted  Radiographic Exam (right foot, 3 weightbearing views, 12/17/2023):  Normal osseous mineralization.  There is a triangular abnormality at the first metatarsal-medial cuneiform joint.  Unsure if this is an avulsion fracture or small fracture of the body of the medial cuneiform.  This is only seen on the AP view of the foot.  Cannot correlate with any other views.  This is in the area of clinical pain and swelling.  No obvious dislocation is noted.  Assessment/Plan of Care: 1. Injury of right foot, subsequent encounter   2. Capsulitis of right foot   3. Difficulty walking   4. History of fall     MR FOOT RIGHT WO CONTRAST  Inform patient that x-ray is suspicious for possible fracture.  Will get her set up for an MRI to evaluate the right midfoot.  This would be without contrast.  Will also have the patient sign a medical records release form so that we can obtain the records from her urgent care facility.  Patient was shown one of our pneumatic cam walkers.  She stated this is not what she was dispensed from the  urgent care facility.  She felt that this was more supportive.  She was fitted for this and shown how to use the pneumatic portion.  She needs to use the compression all the time.  She is to wear this at all times weightbearing.  She can remove for bathing.  Limit weightbearing activities.  Ice and elevate the right foot 3-5 times throughout the day for 20 minutes at a time.  She will continue with her meloxicam  15 mg once daily as prescribed by another provider.  Follow-up after we receive the MRI report  Ayra Hodgdon CHARM Imperial, DPM, FACFAS [facility]     [address] [place], [place] [address]                Office [phone]  Fax [phone]

## 2023-12-29 ENCOUNTER — Ambulatory Visit (HOSPITAL_BASED_OUTPATIENT_CLINIC_OR_DEPARTMENT_OTHER)
Admission: RE | Admit: 2023-12-29 | Discharge: 2023-12-29 | Disposition: A | Source: Ambulatory Visit | Attending: Podiatry | Admitting: Radiology

## 2023-12-29 DIAGNOSIS — S99921D Unspecified injury of right foot, subsequent encounter: Secondary | ICD-10-CM

## 2023-12-29 DIAGNOSIS — M25571 Pain in right ankle and joints of right foot: Secondary | ICD-10-CM | POA: Diagnosis not present

## 2023-12-29 DIAGNOSIS — R262 Difficulty in walking, not elsewhere classified: Secondary | ICD-10-CM

## 2023-12-29 DIAGNOSIS — Z9181 History of falling: Secondary | ICD-10-CM

## 2023-12-30 ENCOUNTER — Ambulatory Visit: Payer: Self-pay | Admitting: Podiatry

## 2024-01-28 ENCOUNTER — Ambulatory Visit (INDEPENDENT_AMBULATORY_CARE_PROVIDER_SITE_OTHER): Admitting: Podiatry

## 2024-01-28 DIAGNOSIS — S93324D Dislocation of tarsometatarsal joint of right foot, subsequent encounter: Secondary | ICD-10-CM | POA: Diagnosis not present

## 2024-01-28 NOTE — Progress Notes (Signed)
  Chief Complaint  Patient presents with   Foot Pain    R foot is feeling 90% improvement. Has a little stiffness in ankle.  Not diabetic.  No anti coag    HPI: 68 y.o. female presents today for follow-up of right midfoot pain.  Her MRI had shown a possible dislocation at the Lisfranc joint as well as tendon injury to the peroneus longus tendon and tendinitis.  She had been immobilized in the pneumatic cam walker for 6 to 7 weeks.  She has no pain at this time and is very pleased.  States all of her swelling has resolved since last seen.  She is ready to return to shoes.  Past Medical History:  Diagnosis Date   Hyperlipidemia    Hypertension    Palpitations 12/17/2010   Tobacco abuse    Past Surgical History:  Procedure Laterality Date   ABDOMINAL HYSTERECTOMY     partial   BREAST EXCISIONAL BIOPSY Right over 10 years   BREAST LUMPECTOMY     Allergies  Allergen Reactions   Lisinopril  Cough   Tetracyclines & Related Other (See Comments)    Pt reports she gets shakey   Physical Exam: Palpable pedal pulses.  No appreciable edema to the right foot.  There is no pain to the 1st or 2nd metatarsal-cuneiform joints on the right foot.  No pain on palpation at the peroneus longus insertion on the plantar aspect of the first TMT joint.  No pain with resisted plantarflexion of the first ray.  No pain with passive range of motion of the first ray.  No pain near the master knot of Victory.  No gaps or nodules noted with the tendons in the area.  No pain along the tibialis anterior tendon.  No ecchymosis or erythema is noted.  Assessment/Plan of Care: 1. Closed dislocation of tarsometatarsal joint of right foot, subsequent encounter     Discussed MRI findings with the patient today and gave her a copy for her records.  She notes her family doctor is not within the [facility] health system and would like a copy for their records.  As since the patient is asymptomatic at this time, we dispensed a pair  of power step inserts (courtesy) for additional support as she transitions into regular shoes.  She will spend the next week weaning back and forth between the pneumatic cam walker and her supportive shoes.  Walking barefoot and wearing foot flops are not recommended at this time.  She will try this for the next 7 to 10 days and if she is having any difficulties or weakness, she can call and we can get her referred to physical therapy for some formal PT and strengthening and stretching program.  Otherwise follow-up as needed    Awanda CHARM Imperial, DPM, FACFAS [facility]     [address] [place], [place] [address]                Office [phone]  Fax [phone]
